# Patient Record
Sex: Male | Born: 1955 | Race: Black or African American | Hispanic: No | Marital: Single | Smoking: Former smoker
Health system: Southern US, Community
[De-identification: ages and names within clinical notes are randomized; demographics above are authoritative.]

## PROBLEM LIST (undated history)

## (undated) DIAGNOSIS — K219 Gastro-esophageal reflux disease without esophagitis: Secondary | ICD-10-CM

## (undated) DIAGNOSIS — F319 Bipolar disorder, unspecified: Secondary | ICD-10-CM

## (undated) DIAGNOSIS — I10 Essential (primary) hypertension: Secondary | ICD-10-CM

## (undated) DIAGNOSIS — F32A Depression, unspecified: Secondary | ICD-10-CM

## (undated) DIAGNOSIS — F329 Major depressive disorder, single episode, unspecified: Secondary | ICD-10-CM

## (undated) HISTORY — DX: Gastro-esophageal reflux disease without esophagitis: K21.9

## (undated) HISTORY — DX: Major depressive disorder, single episode, unspecified: F32.9

## (undated) HISTORY — DX: Depression, unspecified: F32.A

## (undated) HISTORY — PX: BACK SURGERY: SHX140

---

## 2016-03-15 ENCOUNTER — Emergency Department (HOSPITAL_COMMUNITY)
Admission: EM | Admit: 2016-03-15 | Discharge: 2016-03-15 | Disposition: A | Discharge status: Home / Self Care | Payer: Self-pay | Attending: Emergency Medicine | Admitting: Emergency Medicine

## 2016-03-15 ENCOUNTER — Encounter (HOSPITAL_COMMUNITY): Payer: Self-pay | Admitting: *Deleted

## 2016-03-15 ENCOUNTER — Emergency Department (HOSPITAL_COMMUNITY): Payer: Self-pay

## 2016-03-15 DIAGNOSIS — M79644 Pain in right finger(s): Secondary | ICD-10-CM | POA: Insufficient documentation

## 2016-03-15 DIAGNOSIS — Z87891 Personal history of nicotine dependence: Secondary | ICD-10-CM | POA: Insufficient documentation

## 2016-03-15 HISTORY — DX: Bipolar disorder, unspecified: F31.9

## 2016-03-15 MED ORDER — HYDROCODONE-ACETAMINOPHEN 5-325 MG PO TABS: 2.0000 | ORAL_TABLET | ORAL | 0 refills | Status: DC | PRN

## 2016-03-15 MED ORDER — IBUPROFEN 600 MG PO TABS: 600.0000 mg | ORAL_TABLET | Freq: Three times a day (TID) | ORAL | 0 refills | Status: DC

## 2016-03-15 NOTE — ED Notes (Signed)
Declined W/C at D/C and was escorted to lobby by RN. 

## 2016-03-15 NOTE — ED Provider Notes (Signed)
[facility] Provider Note   CSN: [id number] Arrival date & time: 03/15/16  1233  By signing my name below, I, Sonum Patel, attest that this documentation has been prepared under the direction and in the presence of Wells FargoKelly Evalisse Prajapati, PA-C. Electronically Signed: Leone PayorSonum Patel, Scribe. 03/15/16. 3:13 PM.  History   Chief Complaint Chief Complaint  Patient presents with  . Hand Pain    The history is provided by the patient. No language interpreter was used.     HPI Comments: Chad Thornton is a 60 y.o. male who presents to the Emergency Department complaining of gradual onset, constant right 2nd and 3rd finger pain with associated swelling that began last night. He is unsure how he injured the affected area but states it must have happened at work. He denies numbness or weakness. Patient is right hand dominant. He states he has not had this pain in the past. Has not tried anything for pain at home.  Past Medical History:  Diagnosis Date  . Bipolar 1 disorder (HCC)     There are no active problems to display for this patient.   History reviewed. No pertinent surgical history.     Home Medications    Prior to Admission medications   Not on File    Family History No family history on file.  Social History Social History  Substance Use Topics  . Smoking status: Former Games developermoker  . Smokeless tobacco: Never Used  . Alcohol use No     Allergies   Review of patient's allergies indicates no known allergies.   Review of Systems Review of Systems  Musculoskeletal: Positive for arthralgias and joint swelling.  Neurological: Negative for weakness and numbness.     Physical Exam Updated Vital Signs BP 153/83 (BP Location: Right Arm)   Pulse 79   Temp 98.2 F (36.8 C) (Oral)   Ht 6\' 1"  (1.854 m)   SpO2 100%   Physical Exam  Constitutional: He is oriented to person, place, and time. He appears well-developed and well-nourished.  HENT:  Head: Normocephalic and  atraumatic.  Cardiovascular: Normal rate.   Pulmonary/Chest: Effort normal.  Musculoskeletal:  Right wrist and hand: There is mild swelling of PIP and DIP joints of the middle finger with tenderness to palpation. Decreased ROM due to pain and swelling. N/V intact.   Neurological: He is alert and oriented to person, place, and time.  Skin: Skin is warm and dry.  Psychiatric: He has a normal mood and affect.  Nursing note and vitals reviewed.    ED Treatments / Results  DIAGNOSTIC STUDIES: Oxygen Saturation is 100% on RA, normal by my interpretation.    COORDINATION OF CARE: 3:12 PM Discussed treatment plan with pt at bedside and pt agreed to plan.   Labs (all labs ordered are listed, but only abnormal results are displayed) Labs Reviewed - No data to display  EKG  EKG Interpretation None       Radiology Dg Hand Complete Right  Result Date: 03/15/2016 CLINICAL DATA:  Pain and swelling in the right hand, most prominent in the third finger. Injury 1 day prior. Palpable lump in the palmar distal third metacarpal region. EXAM: RIGHT HAND - COMPLETE 3+ VIEW COMPARISON:  None. FINDINGS: The area of symptomatic concern as indicated by the patient in the palmar right distal third metacarpal region was denoted with a metallic skin BB by the technologist. No fracture, dislocation or suspicious focal osseous lesion. There is mild-to-moderate osteoarthritis at the third metacarpophalangeal joint.  There are amorphous periarticular soft tissue calcifications at the radial aspect of the proximal interphalangeal joint of the right third finger with associated soft tissue swelling. No appreciable joint erosions. No radiopaque foreign body. IMPRESSION: 1. No fracture or dislocation. 2. 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 3.  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 Electronically Signed   By: Delbert Phenix M.D.   On: 03/15/2016 14:39    Procedures Procedures (including critical care time)  Medications Ordered in ED Medications - No data to display   Initial Impression / Assessment and Plan / ED Course  I have reviewed the triage vital signs and the nursing notes.  Pertinent labs & imaging results that were available during my care of the patient were reviewed by me and considered in my medical decision making (see chart for details).  Clinical Course   Patient X-Ray negative for obvious fracture or dislocation.  Xray does comment on the possibility of HADD and patient has underlying arthritis of MCP joint. Patient given splint while in ED, conservative therapy recommended and discussed. Follow up with ortho if symptoms persist. Patient will be discharged home & is agreeable with above plan. Returns precautions discussed. Pt appears safe for discharge.   Final Clinical Impressions(s) / ED Diagnoses   Final diagnoses:  Pain of right middle finger    New Prescriptions Discharge Medication List as of 03/15/2016  3:19 PM    START taking these medications   Details  HYDROcodone-acetaminophen (NORCO/VICODIN) 5-325 MG tablet Take 2 tablets by mouth every 4 (four) hours as needed., Starting Mon 03/15/2016, Print    ibuprofen (ADVIL,MOTRIN) 600 MG tablet Take 1 tablet (600 mg total) by mouth 3 (three) times daily., Starting Mon 03/15/2016, Print        I personally performed the services described in this documentation, which was scribed in my presence. The recorded information has been reviewed and is accurate.    Bethel Born, PA-C 03/16/16 1740    Gerhard Munch, MD 03/18/16 [phone]

## 2016-03-15 NOTE — ED Triage Notes (Signed)
Pt sates swelling and difficulty bending R 2nd and 3rd fingers since last night.  Does not remember injuring it, but he works at the [employer] and may have banged it against a cage.

## 2016-04-23 ENCOUNTER — Encounter (HOSPITAL_COMMUNITY): Payer: Self-pay

## 2016-04-23 ENCOUNTER — Emergency Department (HOSPITAL_COMMUNITY)
Admission: EM | Admit: 2016-04-23 | Discharge: 2016-04-23 | Disposition: A | Discharge status: Home / Self Care | Payer: Medicare (Managed Care) | Attending: Emergency Medicine | Admitting: Emergency Medicine

## 2016-04-23 DIAGNOSIS — I10 Essential (primary) hypertension: Secondary | ICD-10-CM | POA: Insufficient documentation

## 2016-04-23 DIAGNOSIS — H578 Other specified disorders of eye and adnexa: Secondary | ICD-10-CM | POA: Diagnosis present

## 2016-04-23 DIAGNOSIS — H1132 Conjunctival hemorrhage, left eye: Secondary | ICD-10-CM | POA: Diagnosis not present

## 2016-04-23 DIAGNOSIS — Z87891 Personal history of nicotine dependence: Secondary | ICD-10-CM | POA: Diagnosis not present

## 2016-04-23 HISTORY — DX: Essential (primary) hypertension: I10

## 2016-04-23 MED ORDER — BENZONATATE 100 MG PO CAPS: 100.0000 mg | ORAL_CAPSULE | Freq: Three times a day (TID) | ORAL | 0 refills | Status: DC

## 2016-04-23 NOTE — ED Provider Notes (Signed)
[facility] Provider Note   CSN: [id number] Arrival date & time: 04/23/16  1720     History   Chief Complaint Chief Complaint  Patient presents with  . Eye Problem    HPI Chad Thornton is a 60 y.o. male.  HPI Patient presents to the emergency room with complaints of eye redness. Patient noticed it when he woke up on Wednesday. He noticed that the medial aspect of his left eye was red. He denies any recent injuries. He denies any drainage. No blurred vision or pain. Patient has developed a cold recently and has been coughing but he does not think he was coughing this first started. Past Medical History:  Diagnosis Date  . Bipolar 1 disorder (HCC)   . Hypertension     There are no active problems to display for this patient.   Past Surgical History:  Procedure Laterality Date  . BACK SURGERY     2000       Home Medications    Prior to Admission medications   Medication Sig Start Date End Date Taking? Authorizing Provider  divalproex (DEPAKOTE) 500 MG DR tablet Take 500 mg by mouth 3 (three) times daily.   Yes Historical Provider, MD  mirtazapine (REMERON) 15 MG tablet Take 15 mg by mouth at bedtime.   Yes Historical Provider, MD  QUEtiapine (SEROQUEL) 400 MG tablet Take 400 mg by mouth at bedtime.   Yes Historical Provider, MD  benzonatate (TESSALON) 100 MG capsule Take 1 capsule (100 mg total) by mouth every 8 (eight) hours. 04/23/16   Linwood Dibbles, MD  HYDROcodone-acetaminophen (NORCO/VICODIN) 5-325 MG tablet Take 2 tablets by mouth every 4 (four) hours as needed. Patient not taking: Reported on 04/23/2016 03/15/16   Bethel Born, PA-C  ibuprofen (ADVIL,MOTRIN) 600 MG tablet Take 1 tablet (600 mg total) by mouth 3 (three) times daily. Patient not taking: Reported on 04/23/2016 03/15/16   Bethel Born, PA-C    Family History No family history on file.  Social History Social History  Substance Use Topics  . Smoking status: Former Games developer  .  Smokeless tobacco: Never Used  . Alcohol use No     Allergies   Review of patient's allergies indicates no known allergies.   Review of Systems Review of Systems  All other systems reviewed and are negative.    Physical Exam Updated Vital Signs BP 158/92   Pulse 89   Temp 98.5 F (36.9 C) (Oral)   Resp 18   Ht 6\' 1"  (1.854 m)   Wt 108.9 kg   SpO2 100%   BMI 31.66 kg/m   Physical Exam  Constitutional: He appears well-developed and well-nourished. No distress.  HENT:  Head: Normocephalic and atraumatic.  Right Ear: External ear normal.  Left Ear: External ear normal.  Eyes: EOM are normal. Pupils are equal, round, and reactive to light. Right eye exhibits no discharge and no exudate. Left eye exhibits no discharge and no exudate. Right conjunctiva is not injected. Left conjunctiva is not injected. Left conjunctiva has a hemorrhage. No scleral icterus.    Neck: Neck supple. No tracheal deviation present.  Cardiovascular: Normal rate.   Pulmonary/Chest: Effort normal and breath sounds normal. No stridor. No respiratory distress.  Abdominal: He exhibits no distension.  Musculoskeletal: He exhibits no edema.  Neurological: He is alert. Cranial nerve deficit: no gross deficits.  Skin: Skin is warm and dry. No rash noted.  Psychiatric: He has a normal mood and affect.  Nursing note  and vitals reviewed.    ED Treatments / Results     Procedures Procedures (including critical care time)    Initial Impression / Assessment and Plan / ED Course  I have reviewed the triage vital signs and the nursing notes.  Pertinent labs & imaging results that were available during my care of the patient were reviewed by me and considered in my medical decision making (see chart for details).  Clinical Course    Patient has a subconjunctival hemorrhage left eye. He has a cold and has been coughing although he thinks this started before the cough. He denies any visual acuity  change. He denies any drainage.  I explained the benign nature of this illness the patient. He can follow-up with an ophthalmologist as needed.  Final Clinical Impressions(s) / ED Diagnoses   Final diagnoses:  Subconjunctival hemorrhage, non-traumatic, left    New Prescriptions New Prescriptions   BENZONATATE (TESSALON) 100 MG CAPSULE    Take 1 capsule (100 mg total) by mouth every 8 (eight) hours.     Linwood DibblesJon Deriyah Kunath, MD 04/23/16 [phone]

## 2016-04-23 NOTE — ED Notes (Signed)
Patient Alert and oriented X4. Stable and ambulatory. Patient verbalized understanding of the discharge instructions.  Patient belongings were taken by the patient.  

## 2016-04-23 NOTE — ED Triage Notes (Signed)
Per pT, Pt woke up on Wednesday morning with redness noted to the inside of the left eye. Pt denies change in vision, pain, or discharge. Hx of HTN, but denies any heaving lifting or excursion.

## 2016-04-23 NOTE — Discharge Instructions (Signed)
You can use over-the-counter artificial tears as needed, follow up with an eye doctor as needed

## 2016-06-29 ENCOUNTER — Encounter (HOSPITAL_COMMUNITY): Payer: Self-pay | Admitting: *Deleted

## 2016-06-29 ENCOUNTER — Emergency Department (HOSPITAL_COMMUNITY)
Admission: EM | Admit: 2016-06-29 | Discharge: 2016-06-29 | Disposition: A | Discharge status: Home / Self Care | Payer: Medicare (Managed Care) | Attending: Emergency Medicine | Admitting: Emergency Medicine

## 2016-06-29 DIAGNOSIS — I10 Essential (primary) hypertension: Secondary | ICD-10-CM | POA: Insufficient documentation

## 2016-06-29 DIAGNOSIS — R51 Headache: Secondary | ICD-10-CM | POA: Insufficient documentation

## 2016-06-29 DIAGNOSIS — Z87891 Personal history of nicotine dependence: Secondary | ICD-10-CM | POA: Insufficient documentation

## 2016-06-29 DIAGNOSIS — M545 Low back pain, unspecified: Secondary | ICD-10-CM

## 2016-06-29 DIAGNOSIS — Z79899 Other long term (current) drug therapy: Secondary | ICD-10-CM | POA: Insufficient documentation

## 2016-06-29 DIAGNOSIS — R519 Headache, unspecified: Secondary | ICD-10-CM

## 2016-06-29 DIAGNOSIS — M62838 Other muscle spasm: Secondary | ICD-10-CM | POA: Diagnosis not present

## 2016-06-29 DIAGNOSIS — M542 Cervicalgia: Secondary | ICD-10-CM | POA: Diagnosis present

## 2016-06-29 MED ORDER — CYCLOBENZAPRINE HCL 10 MG PO TABS
5.0000 mg | ORAL_TABLET | Freq: Once | ORAL | Status: AC
  Administered 2016-06-29: 5 mg via ORAL
  Filled 2016-06-29: qty 1

## 2016-06-29 MED ORDER — CYCLOBENZAPRINE HCL 10 MG PO TABS: 10.0000 mg | ORAL_TABLET | Freq: Two times a day (BID) | ORAL | 0 refills | Status: DC | PRN

## 2016-06-29 MED ORDER — ACETAMINOPHEN 325 MG PO TABS
325.0000 mg | ORAL_TABLET | Freq: Once | ORAL | Status: AC
  Administered 2016-06-29: 325 mg via ORAL
  Filled 2016-06-29: qty 1

## 2016-06-29 MED ORDER — ACETAMINOPHEN 500 MG PO TABS: 500.0000 mg | ORAL_TABLET | Freq: Four times a day (QID) | ORAL | 0 refills | Status: DC | PRN

## 2016-06-29 MED FILL — ?CYCLOBENZAPRINE 10 MG TABL: 10 | 10 days supply | Qty: 20 | Fill #0

## 2016-06-29 NOTE — ED Triage Notes (Signed)
C/o stiff neck onset 5-6 days ago no injury . States it isn't getting anyt better

## 2016-06-29 NOTE — Discharge Instructions (Signed)
Please take Tylenol as needed for pain. Please take one tablet of Flexeril twice a day as needed for muscle spasm. These use warm compresses to help alleviate pain.  SEEK MEDICAL CARE IF:   Your cramps or spasms get more severe, more frequent, or do not improve over time.  Your headache becomes severe. You have repeated vomiting. You have a stiff neck. You have a loss of vision. You have problems with speech. You have pain in the eye or ear. You have muscular weakness or loss of muscle control. You lose your balance or have trouble walking. You feel faint or pass out. You have confusion. You develop new bowel or bladder control problems. You have unusual weakness or numbness in your arms or legs. You develop nausea or vomiting. You develop abdominal pain. You feel faint. You develop new bowel or bladder control problems. You have unusual weakness or numbness in your arms or legs. You develop nausea or vomiting. You develop abdominal pain. You feel faint.

## 2016-06-29 NOTE — ED Provider Notes (Signed)
[facility] Provider Note   CSN: [id number] Arrival date & time: 06/29/16  0901  By signing my name below, I, Soijett Blue, attest that this documentation has been prepared under the direction and in the presence of Lorretta Harp, PA-C Electronically Signed: Soijett Blue, ED Scribe. 06/29/16. 9:31 AM.  History   Chief Complaint Chief Complaint  Patient presents with  . Neck Pain    HPI Chad Thornton is a 61 y.o. male with a PMHx of HTN, who presents to the Emergency Department complaining of 10/10, aching, left sided neck pain onset 5-6 days ago. Pt noticed his left sided neck pain upon waking up. Pt notes that his left sided neck pain radiates to his head and his upper back. Pt left sided neck pain is worsened throughout the day and with movement.  He is having associated symptoms of lower back pain x 5-6 days and generalized HA x 5-6 days. 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 He denies fever, chills, left shoulder pain, dysuria, frequency, constipation, diarrhea, blurred vision, vision change, gait problem, numbness, tingling, CP, SOB, history of cancer, IVDU, and any other symptoms. Denies recent injury or trauma. 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     The history is provided by the patient. No language interpreter was used.    Past Medical History:  Diagnosis Date  . Bipolar 1 disorder (HCC)   . Hypertension     There are no active problems to display for this patient.   Past Surgical History:  Procedure Laterality Date  . BACK SURGERY     2000       Home Medications    Prior to Admission medications   Medication Sig Start Date End Date Taking? Authorizing Provider  acetaminophen (TYLENOL) 500 MG tablet Take 1 tablet (500 mg total) by mouth every 6 (six) hours as needed. 06/29/16   Francisco Manuel [place], [place]  benzonatate (TESSALON) 100 MG capsule Take 1 capsule (100 mg total) by mouth every 8  (eight) hours. 04/23/16   Linwood Dibbles, MD  cyclobenzaprine (FLEXERIL) 10 MG tablet Take 1 tablet (10 mg total) by mouth 2 (two) times daily as needed for muscle spasms. 06/29/16   Francisco Manuel [place], [place]  divalproex (DEPAKOTE) 500 MG DR tablet Take 500 mg by mouth 3 (three) times daily.    Historical Provider, MD  HYDROcodone-acetaminophen (NORCO/VICODIN) 5-325 MG tablet Take 2 tablets by mouth every 4 (four) hours as needed. Patient not taking: Reported on 04/23/2016 03/15/16   Bethel Born, PA-C  ibuprofen (ADVIL,MOTRIN) 600 MG tablet Take 1 tablet (600 mg total) by mouth 3 (three) times daily. Patient not taking: Reported on 04/23/2016 03/15/16   Bethel Born, PA-C  mirtazapine (REMERON) 15 MG tablet Take 15 mg by mouth at bedtime.    Historical Provider, MD  QUEtiapine (SEROQUEL) 400 MG tablet Take 400 mg by mouth at bedtime.    Historical Provider, MD    Family History History reviewed. No pertinent family history.  Social History Social History  Substance Use Topics  . Smoking status: Former Games developer  . Smokeless tobacco: Never Used  . Alcohol use No     Allergies   Patient has no known allergies.   Review of Systems Review of Systems  Constitutional: Negative for chills and fever.  Eyes: Negative for visual disturbance.  Respiratory: Negative for shortness of breath.   Cardiovascular: Negative for chest  pain.  Gastrointestinal: Negative for constipation and diarrhea.  Genitourinary: Negative for difficulty urinating, dysuria and hematuria.  Musculoskeletal: Positive for back pain (upper and lower) and neck pain (left sided). Negative for arthralgias and gait problem.  Skin: Negative for color change, rash and wound.  Neurological: Positive for headaches (generalized). Negative for numbness.       No tingling     Physical Exam Updated Vital Signs BP 164/78 (BP Location: Right Arm)   Pulse 77   Temp 98.3 F (36.8 C) (Oral)   Resp 18   Ht 6\' 1"  (1.854 m)    Wt 113.4 kg   SpO2 100%   BMI 32.98 kg/m   Physical Exam  Constitutional: He is oriented to person, place, and time. He appears well-developed and well-nourished. No distress.  HENT:  Head: Normocephalic and atraumatic.  Eyes: Conjunctivae and EOM are normal. Pupils are equal, round, and reactive to light.  Neck: Normal range of motion. Neck supple. Muscular tenderness present. No spinous process tenderness present.  No erythema, swelling, edema, or deformity. TTP in left paraspinal muscles. No central tenderness to spinous process. Full passive ROM, but slightly decreased on active ROM.   Cardiovascular: Normal rate.   Pulmonary/Chest: Effort normal. No respiratory distress.  Abdominal: He exhibits no distension and no mass. There is no tenderness. There is no tenderness at McBurney's point and negative Murphy's sign.  No focal tenderness. No palpable pulsatile mass. Negative murphy's sign. Negative McBurney's point.   Musculoskeletal: Normal range of motion.  Neurological: He is alert and oriented to person, place, and time. He has normal strength. No sensory deficit. Coordination and gait normal.  Cranial Nerves:  III,IV, VI: ptosis not present, extra-ocular movements intact bilaterally, direct and consensual pupillary light reflexes intact bilaterally V: facial sensation, jaw opening, and bite strength equal bilaterally VII: eyebrow raise, eyelid close, smile, frown, pucker equal bilaterally VIII: hearing grossly normal bilaterally  IX,X: palate elevation and swallowing intact XI: bilateral shoulder shrug and lateral head rotation equal and strong XII: midline tongue extension  Negative pronator drift, negative finger to nose, heel to shin, negative finger to nose, negative RAMs.   Patient able to ambulate without difficulty.  Strength and sensation intact to BUE and BLE.   Skin: Skin is warm and dry.  Psychiatric: He has a normal mood and affect. His behavior is normal.  Nursing  note and vitals reviewed.   ED Treatments / Results  DIAGNOSTIC STUDIES: Oxygen Saturation is 100% on RA, nl by my interpretation.    COORDINATION OF CARE: 9:27 AM Discussed treatment plan with pt at bedside which includes tylenol Rx and flexeril Rx, warm compresses, and pt agreed to plan.   Procedures Procedures (including critical care time)  Medications Ordered in ED Medications  acetaminophen (TYLENOL) tablet 325 mg (325 mg Oral Given 06/29/16 0940)  cyclobenzaprine (FLEXERIL) tablet 5 mg (5 mg Oral Given 06/29/16 0940)     Initial Impression / Assessment and Plan / ED Course  I have reviewed the triage vital signs and the nursing notes.  Clinical Course   Patient is a 61 year old male presenting with left-sided neck pain 4 days. On exam patient is afebrile, apparent distress, vital signs stable. Heart and lung sounds clear. TTP to left cervical paraspinal muscles/trapezius. No redness, edema, or deformity noted. Abdomen soft nontender. Negative Murphy sign, no focal tenderness at McBurney's point. No palpable mass. Neuro exam is normal. Cranialnerves III through XII intact. Negative pronator drift, negative finger to nose, negative  RAMs. Patient able to walk without difficulty.  No warning symptoms of back pain including: fecal incontinence, urinary retention or overflow incontinence, night sweats, waking from sleep with back pain, unexplained fevers or weight loss, h/o cancer, IVDU, recent trauma. No concern for cauda equina, epidural abscess, or other serious cause of back pain. Conservative measures such as rest, ice/heat and pain medicine indicated with PCP follow-up if no improvement with conservative management.    Final Clinical Impressions(s) / ED Diagnoses   Final diagnoses:  Muscle spasms of neck  Nonintractable headache, unspecified chronicity pattern, unspecified headache type  Acute left-sided low back pain without sciatica    New Prescriptions Discharge  Medication List as of 06/29/2016  9:39 AM    START taking these medications   Details  acetaminophen (TYLENOL) 500 MG tablet Take 1 tablet (500 mg total) by mouth every 6 (six) hours as needed., Starting Tue 06/29/2016, Print    cyclobenzaprine (FLEXERIL) 10 MG tablet Take 1 tablet (10 mg total) by mouth 2 (two) times daily as needed for muscle spasms., Starting Tue 06/29/2016, Print       I personally performed the services described in this documentation, which was scribed in my presence. The recorded information has been reviewed and is accurate.     [address]Francisco Manuel ElmoEspina, [place] 06/29/16 1734    Jacalyn LefevreJulie Haviland, MD 07/01/16 [phone]

## 2016-08-24 ENCOUNTER — Emergency Department (HOSPITAL_COMMUNITY): Payer: Medicare (Managed Care)

## 2016-08-24 ENCOUNTER — Emergency Department (HOSPITAL_COMMUNITY)
Admission: EM | Admit: 2016-08-24 | Discharge: 2016-08-24 | Disposition: A | Discharge status: Home / Self Care | Payer: Medicare (Managed Care) | Attending: Emergency Medicine | Admitting: Emergency Medicine

## 2016-08-24 ENCOUNTER — Encounter (HOSPITAL_COMMUNITY): Payer: Self-pay | Admitting: Emergency Medicine

## 2016-08-24 DIAGNOSIS — Z79899 Other long term (current) drug therapy: Secondary | ICD-10-CM | POA: Diagnosis not present

## 2016-08-24 DIAGNOSIS — J111 Influenza due to unidentified influenza virus with other respiratory manifestations: Secondary | ICD-10-CM | POA: Diagnosis not present

## 2016-08-24 DIAGNOSIS — Z87891 Personal history of nicotine dependence: Secondary | ICD-10-CM | POA: Diagnosis not present

## 2016-08-24 DIAGNOSIS — I10 Essential (primary) hypertension: Secondary | ICD-10-CM | POA: Diagnosis not present

## 2016-08-24 DIAGNOSIS — R69 Illness, unspecified: Secondary | ICD-10-CM

## 2016-08-24 DIAGNOSIS — R509 Fever, unspecified: Secondary | ICD-10-CM | POA: Diagnosis present

## 2016-08-24 MED ORDER — FLUTICASONE PROPIONATE 50 MCG/ACT NA SUSP: 2.0000 | Freq: Every day | NASAL | 0 refills | Status: DC

## 2016-08-24 MED ORDER — ALBUTEROL SULFATE HFA 108 (90 BASE) MCG/ACT IN AERS
2.0000 | INHALATION_SPRAY | Freq: Once | RESPIRATORY_TRACT | Status: AC
  Administered 2016-08-24: 2 via RESPIRATORY_TRACT
  Filled 2016-08-24: qty 6.7

## 2016-08-24 MED ORDER — CETIRIZINE HCL 10 MG PO TABS: 10.0000 mg | ORAL_TABLET | Freq: Every day | ORAL | 1 refills | Status: DC

## 2016-08-24 MED ORDER — ACETAMINOPHEN 325 MG PO TABS: 650.0000 mg | ORAL_TABLET | Freq: Four times a day (QID) | ORAL | 0 refills | Status: DC | PRN

## 2016-08-24 MED ORDER — BENZONATATE 100 MG PO CAPS: 100.0000 mg | ORAL_CAPSULE | Freq: Three times a day (TID) | ORAL | 0 refills | Status: DC | PRN

## 2016-08-24 MED ORDER — IPRATROPIUM BROMIDE 0.02 % IN SOLN
0.5000 mg | Freq: Once | RESPIRATORY_TRACT | Status: AC
  Administered 2016-08-24: 0.5 mg via RESPIRATORY_TRACT
  Filled 2016-08-24: qty 2.5

## 2016-08-24 MED ORDER — ALBUTEROL SULFATE (2.5 MG/3ML) 0.083% IN NEBU
5.0000 mg | INHALATION_SOLUTION | Freq: Once | RESPIRATORY_TRACT | Status: AC
  Administered 2016-08-24: 5 mg via RESPIRATORY_TRACT
  Filled 2016-08-24: qty 6

## 2016-08-24 NOTE — ED Provider Notes (Signed)
[facility]EMER[facility]ENCY DEPT Provider Note   CSN: [id number] Arrival date & time: 08/24/16  0801     History   Chief Complaint Chief Complaint  Patient presents with  . Influenza    HPI Chad Thornton is a 61 y.o. male.  Chad Thornton is a 61 y.o. Male who presents to the ED complaining of flu like symptoms for the past 5 days. Patient reports 5 days of subjective fever, coughing, wheezing, chest tightness, nasal congestion, postnasal drip, sore throat, runny nose and body aches. Patient reports he did not receive his flu shot this year. He reports taking NyQuil prior to arrival this morning with some relief of his symptoms. He denies hemoptysis, leg pain, leg swelling, abdominal pain, vomiting, diarrhea, urinary symptoms, rashes, trouble swallowing, lightheadedness or syncope.   The history is provided by the patient and medical records. No language interpreter was used.  Influenza  Presenting symptoms: cough, fever, myalgias, rhinorrhea, shortness of breath and sore throat   Presenting symptoms: no diarrhea, no headaches, no nausea and no vomiting   Associated symptoms: chills and nasal congestion     Past Medical History:  Diagnosis Date  . Bipolar 1 disorder (HCC)   . Hypertension     There are no active problems to display for this patient.   Past Surgical History:  Procedure Laterality Date  . BACK SURGERY     2000       Home Medications    Prior to Admission medications   Medication Sig Start Date End Date Taking? Authorizing Provider  cyclobenzaprine (FLEXERIL) 10 MG tablet Take 1 tablet (10 mg total) by mouth 2 (two) times daily as needed for muscle spasms. 06/29/16  Yes Francisco [address] [place], [place]  divalproex (DEPAKOTE) 500 MG DR tablet Take 1,500 mg by mouth at bedtime.    Yes Historical Provider, MD  DM-Doxylamine-Acetaminophen (NYQUIL COLD & FLU PO) Take 1 capsule by mouth daily as needed (cold symptoms).   Yes Historical Provider, MD  ibuprofen (ADVIL,MOTRIN)  600 MG tablet Take 1 tablet (600 mg total) by mouth 3 (three) times daily. 03/15/16  Yes Bethel Born, PA-C  mirtazapine (REMERON) 15 MG tablet Take 15 mg by mouth at bedtime.   Yes Historical Provider, MD  Misc Natural Products (NF FORMULAS TESTOSTERONE) CAPS Take 2 capsules by mouth every other day.   Yes Historical Provider, MD  QUEtiapine (SEROQUEL) 400 MG tablet Take 400 mg by mouth at bedtime.   Yes Historical Provider, MD  acetaminophen (TYLENOL) 325 MG tablet Take 2 tablets (650 mg total) by mouth every 6 (six) hours as needed for mild pain, moderate pain or fever. 08/24/16   Everlene Farrier, PA-C  benzonatate (TESSALON) 100 MG capsule Take 1 capsule (100 mg total) by mouth 3 (three) times daily as needed. 08/24/16   Everlene Farrier, PA-C  cetirizine (ZYRTEC ALLERGY) 10 MG tablet Take 1 tablet (10 mg total) by mouth daily. 08/24/16   Everlene Farrier, PA-C  fluticasone (FLONASE) 50 MCG/ACT nasal spray Place 2 sprays into both nostrils daily. 08/24/16   Everlene Farrier, PA-C    Family History History reviewed. No pertinent family history.  Social History Social History  Substance Use Topics  . Smoking status: Former Games developer  . Smokeless tobacco: Never Used  . Alcohol use No     Allergies   Patient has no known allergies.   Review of Systems Review of Systems  Constitutional: Positive for chills and fever.  HENT: Positive for congestion, postnasal drip, rhinorrhea, sneezing and  sore throat. Negative for trouble swallowing.   Eyes: Negative for visual disturbance.  Respiratory: Positive for cough, chest tightness, shortness of breath and wheezing.   Cardiovascular: Negative for chest pain, palpitations and leg swelling.  Gastrointestinal: Negative for abdominal pain, diarrhea, nausea and vomiting.  Genitourinary: Negative for difficulty urinating and dysuria.  Musculoskeletal: Positive for myalgias. Negative for back pain and neck pain.  Skin: Negative for rash.  Neurological:  Negative for syncope, light-headedness and headaches.     Physical Exam Updated Vital Signs BP 197/97 (BP Location: Right Arm)   Pulse 82   Temp 97.9 F (36.6 C) (Oral)   Resp 20   SpO2 97%   Physical Exam  Constitutional: He is oriented to person, place, and time. He appears well-developed and well-nourished. No distress.  Nontoxic appearing.  HENT:  Head: Normocephalic and atraumatic.  Right Ear: External ear normal.  Left Ear: External ear normal.  Mouth/Throat: Oropharynx is clear and moist.  Mild clear middle ear effusion noted bilaterally without any erythema or loss of landmarks.  Boggy nasal turbinates bilaterally. Rhinorrhea present. Mild bilateral tonsillar hypertrophy without exudates. Uvula is midline without edema. No PTA. No trismus. No drooling.  Eyes: Conjunctivae are normal. Pupils are equal, round, and reactive to light. Right eye exhibits no discharge. Left eye exhibits no discharge.  Neck: Neck supple.  Cardiovascular: Normal rate, regular rhythm, normal heart sounds and intact distal pulses.  Exam reveals no gallop and no friction rub.   No murmur heard. Pulmonary/Chest: Effort normal. No respiratory distress. He has wheezes. He has no rales.  Expiratory wheezes noted bilaterally. No increased work of breathing. No rales or rhonchi. Patient speaking in complete sentences.  Abdominal: Soft. There is no tenderness. There is no guarding.  Musculoskeletal: He exhibits no edema or tenderness.  No lower extremity edema or tenderness.  Lymphadenopathy:    He has no cervical adenopathy.  Neurological: He is alert and oriented to person, place, and time. Coordination normal.  Skin: Skin is warm and dry. Capillary refill takes less than 2 seconds. No rash noted. He is not diaphoretic. No erythema. No pallor.  Psychiatric: He has a normal mood and affect. His behavior is normal.  Nursing note and vitals reviewed.    ED Treatments / Results  Labs (all labs ordered  are listed, but only abnormal results are displayed) Labs Reviewed - No data to display  EKG  EKG Interpretation None       Radiology Dg Chest 2 View  Result Date: 08/24/2016 CLINICAL DATA:  Cough and fever. EXAM: CHEST  2 VIEW COMPARISON:  None. FINDINGS: The heart size and mediastinal contours are within normal limits. Both lungs are clear. The visualized skeletal structures are unremarkable. IMPRESSION: Normal exam. Electronically Signed   By: Francene Boyers M.D.   On: 08/24/2016 08:35    Procedures Procedures (including critical care time)  Medications Ordered in ED Medications  albuterol (PROVENTIL HFA;VENTOLIN HFA) 108 (90 Base) MCG/ACT inhaler 2 puff (not administered)  albuterol (PROVENTIL) (2.5 MG/3ML) 0.083% nebulizer solution 5 mg (5 mg Nebulization Given 08/24/16 0859)  ipratropium (ATROVENT) nebulizer solution 0.5 mg (0.5 mg Nebulization Given 08/24/16 0859)     Initial Impression / Assessment and Plan / ED Course  I have reviewed the triage vital signs and the nursing notes.  Pertinent labs & imaging results that were available during my care of the patient were reviewed by me and considered in my medical decision making (see chart for details).  This is a 61 y.o. Male who presents to the ED complaining of flu like symptoms for the past 5 days. Patient reports 5 days of subjective fever, coughing, wheezing, chest tightness, nasal congestion, postnasal drip, sore throat, runny nose and body aches. Patient reports he did not receive his flu shot this year. He reports taking NyQuil prior to arrival this morning with some relief of his symptoms. On exam the patient is afebrile nontoxic appearing. His rhinorrhea present. Boggy nasal turbinates bilaterally. He is not expiratory wheezing noted bilaterally. No increased work of breathing. Chest x-ray is unremarkable. No evidence of pneumonia. Patient received breathing treatment at reevaluation he tells me he is feeling much  better. Feels that his chest tightness and wheezing has resolved. We'll discharge with albuterol inhaler. Advised she can use this every 6 hours as needed for wheezing. Also discharge with prescriptions for Tylenol, some pros, Flonase and Zyrtec. Suspect influenza-like illness. I discussed strict and specific return precautions. I advised the patient to follow-up with their primary care provider this week. I advised the patient to return to the emergency department with new or worsening symptoms or new concerns. The patient verbalized understanding and agreement with plan.      Final Clinical Impressions(s) / ED Diagnoses   Final diagnoses:  Influenza-like illness    New Prescriptions New Prescriptions   ACETAMINOPHEN (TYLENOL) 325 MG TABLET    Take 2 tablets (650 mg total) by mouth every 6 (six) hours as needed for mild pain, moderate pain or fever.   BENZONATATE (TESSALON) 100 MG CAPSULE    Take 1 capsule (100 mg total) by mouth 3 (three) times daily as needed.   CETIRIZINE (ZYRTEC ALLERGY) 10 MG TABLET    Take 1 tablet (10 mg total) by mouth daily.   FLUTICASONE (FLONASE) 50 MCG/ACT NASAL SPRAY    Place 2 sprays into both nostrils daily.     Everlene FarrierWilliam Jonathandavid Marlett, PA-C 08/24/16 1039    Arby BarretteMarcy Pfeiffer, MD 09/01/16 [phone]

## 2016-08-24 NOTE — ED Triage Notes (Signed)
Pt arrives via POV from home with inspiratory/ expiratory wheezing, cough, bodyaches since Thursday. Denies recent fever. VSS. Pt ambulatory.

## 2016-09-10 ENCOUNTER — Emergency Department (HOSPITAL_COMMUNITY): Payer: Medicare (Managed Care)

## 2016-09-10 ENCOUNTER — Emergency Department (HOSPITAL_COMMUNITY)
Admission: EM | Admit: 2016-09-10 | Discharge: 2016-09-10 | Disposition: A | Discharge status: Home / Self Care | Payer: Medicare (Managed Care) | Attending: Emergency Medicine | Admitting: Emergency Medicine

## 2016-09-10 ENCOUNTER — Encounter (HOSPITAL_COMMUNITY): Payer: Self-pay

## 2016-09-10 DIAGNOSIS — Z87891 Personal history of nicotine dependence: Secondary | ICD-10-CM | POA: Diagnosis not present

## 2016-09-10 DIAGNOSIS — I10 Essential (primary) hypertension: Secondary | ICD-10-CM | POA: Diagnosis not present

## 2016-09-10 DIAGNOSIS — H66002 Acute suppurative otitis media without spontaneous rupture of ear drum, left ear: Secondary | ICD-10-CM | POA: Diagnosis not present

## 2016-09-10 DIAGNOSIS — R0981 Nasal congestion: Secondary | ICD-10-CM | POA: Insufficient documentation

## 2016-09-10 DIAGNOSIS — R05 Cough: Secondary | ICD-10-CM | POA: Diagnosis present

## 2016-09-10 DIAGNOSIS — R6889 Other general symptoms and signs: Secondary | ICD-10-CM

## 2016-09-10 DIAGNOSIS — Z79899 Other long term (current) drug therapy: Secondary | ICD-10-CM | POA: Insufficient documentation

## 2016-09-10 LAB — BASIC METABOLIC PANEL
Anion gap: 6 (ref 5–15)
BUN: 15 mg/dL (ref 6–20)
CO2: 28 mmol/L (ref 22–32)
Calcium: 9 mg/dL (ref 8.9–10.3)
Chloride: 102 mmol/L (ref 101–111)
Creatinine, Ser: 1.03 mg/dL (ref 0.61–1.24)
GFR calc Af Amer: >60 mL/min (ref >60)
GFR calc non Af Amer: >60 mL/min (ref >60)
Glucose, Bld: 112 mg/dL — ABNORMAL HIGH (ref 65–99)
Potassium: 4.3 mmol/L (ref 3.5–5.1)
Sodium: 136 mmol/L (ref 135–145)

## 2016-09-10 LAB — CBC WITH DIFFERENTIAL/PLATELET
Basophils Absolute: 0 10*3/uL (ref 0.0–0.1)
Basophils Relative: 0 %
Eosinophils Absolute: 0.5 10*3/uL (ref 0.0–0.7)
Eosinophils Relative: 6 %
HCT: 36.3 % — ABNORMAL LOW (ref 39.0–52.0)
Hemoglobin: 11.7 g/dL — ABNORMAL LOW (ref 13.0–17.0)
Lymphocytes Relative: 19 %
Lymphs Abs: 1.7 10*3/uL (ref 0.7–4.0)
MCH: 31.5 pg (ref 26.0–34.0)
MCHC: 32.2 g/dL (ref 30.0–36.0)
MCV: 97.8 fL (ref 78.0–100.0)
Monocytes Absolute: 1.3 10*3/uL — ABNORMAL HIGH (ref 0.1–1.0)
Monocytes Relative: 14 %
Neutro Abs: 5.4 10*3/uL (ref 1.7–7.7)
Neutrophils Relative %: 61 %
Platelets: 360 10*3/uL (ref 150–400)
RBC: 3.71 MIL/uL — ABNORMAL LOW (ref 4.22–5.81)
RDW: 14 % (ref 11.5–15.5)
WBC: 8.8 10*3/uL (ref 4.0–10.5)

## 2016-09-10 LAB — RAPID STREP SCREEN (MED CTR MEBANE ONLY): Streptococcus, Group A Screen (Direct): NEGATIVE

## 2016-09-10 MED ORDER — OSELTAMIVIR PHOSPHATE 75 MG PO CAPS
75.0000 mg | ORAL_CAPSULE | Freq: Once | ORAL | Status: AC
  Administered 2016-09-10: 75 mg via ORAL
  Filled 2016-09-10: qty 1

## 2016-09-10 MED ORDER — NAPROXEN 500 MG PO TABS: 500.0000 mg | ORAL_TABLET | Freq: Two times a day (BID) | ORAL | 0 refills | Status: DC

## 2016-09-10 MED ORDER — DM-GUAIFENESIN ER 30-600 MG PO TB12: 1.0000 | ORAL_TABLET | Freq: Two times a day (BID) | ORAL | 1 refills | Status: DC

## 2016-09-10 MED ORDER — KETOROLAC TROMETHAMINE 15 MG/ML IJ SOLN
15.0000 mg | Freq: Once | INTRAMUSCULAR | Status: AC
  Administered 2016-09-10: 15 mg via INTRAVENOUS
  Filled 2016-09-10: qty 1

## 2016-09-10 MED ORDER — AMOXICILLIN 500 MG PO CAPS: 500.0000 mg | ORAL_CAPSULE | Freq: Three times a day (TID) | ORAL | 0 refills | Status: DC

## 2016-09-10 MED ORDER — SODIUM CHLORIDE 0.9 % IV SOLN
INTRAVENOUS | Status: DC
  Administered 2016-09-10: 11:00:00 via INTRAVENOUS

## 2016-09-10 MED ORDER — SODIUM CHLORIDE 0.9 % IV BOLUS (SEPSIS)
500.0000 mL | Freq: Once | INTRAVENOUS | Status: AC
  Administered 2016-09-10: 500 mL via INTRAVENOUS

## 2016-09-10 MED ORDER — OSELTAMIVIR PHOSPHATE 75 MG PO CAPS: 75.0000 mg | ORAL_CAPSULE | Freq: Two times a day (BID) | ORAL | 0 refills | Status: DC

## 2016-09-10 NOTE — ED Triage Notes (Signed)
Pt presents for evaluation of continued flu like symptoms x 2 weeks. Pt reports seen here previously, no improvement since. Pt reports productive cough with yellow sputum, fevers at home, and L ear pain. Pt ambulatory, AxO x4.

## 2016-09-10 NOTE — Discharge Instructions (Signed)
Take antibiotic amoxicillin as directed for the ear infection. Take Tamiflu for the flulike illness. Take the Naprosyn for the bodyaches and sore throat and ear pain. Taking Mucinex DM for cough and congestion. Work note provided. Return for any new or worse symptoms.

## 2016-09-10 NOTE — ED Provider Notes (Signed)
[facility] Provider Note   CSN: [id number] Arrival date & time: 09/10/16  0803     History   Chief Complaint Chief Complaint  Patient presents with  . Influenza    HPI Chad Thornton is a 61 y.o. male.  Patient with new onset of bodyaches sore throat and fevers feeling cough productive cough and left ear pain starting on Wednesday 2 days ago. Patient denies any nausea vomiting or diarrhea. Patient had similar type illness several weeks ago. These symptoms are new as of 2 days ago.      Past Medical History:  Diagnosis Date  . Bipolar 1 disorder (HCC)   . Hypertension     There are no active problems to display for this patient.   Past Surgical History:  Procedure Laterality Date  . BACK SURGERY     2000       Home Medications    Prior to Admission medications   Medication Sig Start Date End Date Taking? Authorizing Provider  cetirizine (ZYRTEC ALLERGY) 10 MG tablet Take 1 tablet (10 mg total) by mouth daily. 08/24/16  Yes Everlene FarrierWilliam Dansie, PA-C  cyclobenzaprine (FLEXERIL) 10 MG tablet Take 1 tablet (10 mg total) by mouth 2 (two) times daily as needed for muscle spasms. 06/29/16  Yes Francisco [address]  divalproex (DEPAKOTE) 500 MG DR tablet Take 1,500 mg by mouth at bedtime.    Yes Historical Provider, MD  fluticasone (FLONASE) 50 MCG/ACT nasal spray Place 2 sprays into both nostrils daily. 08/24/16  Yes Everlene FarrierWilliam Dansie, PA-C  mirtazapine (REMERON) 30 MG tablet Take 30 mg by mouth at bedtime.    Yes Historical Provider, MD  QUEtiapine (SEROQUEL) 400 MG tablet Take 400 mg by mouth at bedtime.   Yes Historical Provider, MD  acetaminophen (TYLENOL) 325 MG tablet Take 2 tablets (650 mg total) by mouth every 6 (six) hours as needed for mild pain, moderate pain or fever. Patient not taking: Reported on 09/10/2016 08/24/16   Everlene FarrierWilliam Dansie, PA-C  amoxicillin (AMOXIL) 500 MG capsule Take 1 capsule (500 mg total) by mouth 3 (three) times daily. 09/10/16   Vanetta MuldersScott  Vennesa Bastedo, MD  benzonatate (TESSALON) 100 MG capsule Take 1 capsule (100 mg total) by mouth 3 (three) times daily as needed. Patient not taking: Reported on 09/10/2016 08/24/16   Everlene FarrierWilliam Dansie, PA-C  dextromethorphan-guaiFENesin [facility](MUCINEX DM) 30-600 MG 12hr tablet Take 1 tablet by mouth 2 (two) times daily. 09/10/16   Vanetta MuldersScott Myrna Vonseggern, MD  ibuprofen (ADVIL,MOTRIN) 600 MG tablet Take 1 tablet (600 mg total) by mouth 3 (three) times daily. Patient not taking: Reported on 09/10/2016 03/15/16   Bethel BornKelly Marie Gekas, PA-C  naproxen (NAPROSYN) 500 MG tablet Take 1 tablet (500 mg total) by mouth 2 (two) times daily. 09/10/16   Vanetta MuldersScott Antiono Ettinger, MD  oseltamivir (TAMIFLU) 75 MG capsule Take 1 capsule (75 mg total) by mouth every 12 (twelve) hours. 09/10/16   Vanetta MuldersScott Raffi Milstein, MD    Family History No family history on file.  Social History Social History  Substance Use Topics  . Smoking status: Former Games developermoker  . Smokeless tobacco: Never Used  . Alcohol use No     Allergies   Patient has no known allergies.   Review of Systems Review of Systems  Constitutional: Positive for fever.  HENT: Positive for congestion, ear pain and sore throat.   Eyes: Negative for redness.  Respiratory: Positive for cough.   Cardiovascular: Negative for chest pain.  Gastrointestinal: Negative for abdominal pain.  Genitourinary: Negative  for dysuria.  Musculoskeletal: Positive for myalgias.  Skin: Negative for rash.  Neurological: Negative for headaches.  Hematological: Does not bruise/bleed easily.  Psychiatric/Behavioral: Negative for confusion.     Physical Exam Updated Vital Signs BP (!) 157/93 (BP Location: Left Arm)   Pulse 89   Temp 99.1 F (37.3 C) (Oral)   Resp 16   Ht 6\' 1"  (1.854 m)   Wt 121.1 kg   SpO2 96%   BMI 35.23 kg/m   Physical Exam  Constitutional: He is oriented to person, place, and time. He appears well-developed and well-nourished. No distress.  HENT:  Head: Normocephalic and  atraumatic.  Right Ear: External ear normal.  Mouth/Throat: Oropharynx is clear and moist. No oropharyngeal exudate.  Left TM is red and bulging.  Eyes: Conjunctivae and EOM are normal. Pupils are equal, round, and reactive to light.  Neck: Normal range of motion. Neck supple.  Cardiovascular: Normal rate, regular rhythm and normal heart sounds.   Pulmonary/Chest: Effort normal and breath sounds normal. No respiratory distress. He has no wheezes. He has no rales.  Abdominal: Soft. Bowel sounds are normal. There is no tenderness.  Musculoskeletal: Normal range of motion.  Neurological: He is alert and oriented to person, place, and time. No cranial nerve deficit or sensory deficit. He exhibits normal muscle tone. Coordination normal.  Skin: Skin is warm.  Nursing note and vitals reviewed.    ED Treatments / Results  Labs (all labs ordered are listed, but only abnormal results are displayed) Labs Reviewed  CBC WITH DIFFERENTIAL/PLATELET - Abnormal; Notable for the following:       Result Value   RBC 3.71 (*)    Hemoglobin 11.7 (*)    HCT 36.3 (*)    Monocytes Absolute 1.3 (*)    All other components within normal limits  BASIC METABOLIC PANEL - Abnormal; Notable for the following:    Glucose, Bld 112 (*)    All other components within normal limits  RAPID STREP SCREEN (NOT AT [facility])  CULTURE, GROUP A STREP [facility])    EKG  EKG Interpretation None       Radiology Dg Chest 2 View  Result Date: 09/10/2016 CLINICAL DATA:  Cough and fever EXAM: CHEST  2 VIEW COMPARISON:  August 24, 2016 FINDINGS: The lungs are clear. Heart size and pulmonary vascularity are normal. No adenopathy. No pneumothorax. No bone lesions. IMPRESSION: No edema or consolidation. Electronically Signed   By: Bretta Bang III M.D.   On: 09/10/2016 09:16    Procedures Procedures (including critical care time)  Medications Ordered in ED Medications  0.9 %  sodium chloride infusion ( Intravenous Bolus  from Bag 09/10/16 1031)  oseltamivir (TAMIFLU) capsule 75 mg (not administered)  sodium chloride 0.9 % bolus 500 mL (500 mLs Intravenous New Bag/Given 09/10/16 0858)  ketorolac (TORADOL) 15 MG/ML injection 15 mg (15 mg Intravenous Given 09/10/16 0858)     Initial Impression / Assessment and Plan / ED Course  I have reviewed the triage vital signs and the nursing notes.  Pertinent labs & imaging results that were available during my care of the patient were reviewed by me and considered in my medical decision making (see chart for details).    Patients symptoms consistent with flulike illness. Also does have clearly a left otitis media. That were treated with amoxicillin. Patient symptoms just started on Wednesday so he still within the 48 hour window for Tamiflu. First dose Tamiflu given here because continue on Tamiflu. Chest  x-ray negative for pneumonia or strep negative for strep throat. Patient nontoxic no acute distress improved here with fluids and Toradol for the bodyaches. Will be treated symptomatically with Naprosyn and Mucinex DM. Work note provided.   Final Clinical Impressions(s) / ED Diagnoses   Final diagnoses:  Flu-like symptoms  Acute suppurative otitis media of left ear without spontaneous rupture of tympanic membrane, recurrence not specified    New Prescriptions New Prescriptions   AMOXICILLIN (AMOXIL) 500 MG CAPSULE    Take 1 capsule (500 mg total) by mouth 3 (three) times daily.   DEXTROMETHORPHAN-GUAIFENESIN (MUCINEX DM) 30-600 MG 12HR TABLET    Take 1 tablet by mouth 2 (two) times daily.   NAPROXEN (NAPROSYN) 500 MG TABLET    Take 1 tablet (500 mg total) by mouth 2 (two) times daily.   OSELTAMIVIR (TAMIFLU) 75 MG CAPSULE    Take 1 capsule (75 mg total) by mouth every 12 (twelve) hours.     Vanetta Mulders, MD 09/10/16 1043

## 2016-09-12 LAB — CULTURE, GROUP A STREP (THRC)

## 2016-09-28 ENCOUNTER — Encounter (HOSPITAL_COMMUNITY): Payer: Self-pay | Admitting: Emergency Medicine

## 2016-09-28 ENCOUNTER — Emergency Department (HOSPITAL_COMMUNITY)
Admission: EM | Admit: 2016-09-28 | Discharge: 2016-09-28 | Disposition: A | Discharge status: Home / Self Care | Payer: Medicare (Managed Care) | Attending: Emergency Medicine | Admitting: Emergency Medicine

## 2016-09-28 DIAGNOSIS — Z87891 Personal history of nicotine dependence: Secondary | ICD-10-CM | POA: Insufficient documentation

## 2016-09-28 DIAGNOSIS — G8929 Other chronic pain: Secondary | ICD-10-CM | POA: Insufficient documentation

## 2016-09-28 DIAGNOSIS — Z79899 Other long term (current) drug therapy: Secondary | ICD-10-CM | POA: Insufficient documentation

## 2016-09-28 DIAGNOSIS — M25561 Pain in right knee: Secondary | ICD-10-CM | POA: Diagnosis present

## 2016-09-28 DIAGNOSIS — I10 Essential (primary) hypertension: Secondary | ICD-10-CM | POA: Diagnosis not present

## 2016-09-28 MED ORDER — TRAMADOL HCL 50 MG PO TABS: 50.0000 mg | ORAL_TABLET | Freq: Four times a day (QID) | ORAL | 0 refills | Status: DC | PRN

## 2016-09-28 NOTE — Progress Notes (Signed)
Orthopedic Tech Progress Note Patient Details:  Chad Thornton [birth year] [id number]  Ortho Devices Type of Ortho Device: Knee Sleeve Ortho Device/Splint Interventions: Application   Saul Fordyce 09/28/2016, 5:30 PM

## 2016-09-28 NOTE — ED Provider Notes (Signed)
[facility] Provider Note   CSN: [id number] Arrival date & time: 09/28/16  1551  By signing my name below, I, Cynda Acres, attest that this documentation has been prepared under the direction and in the presence of Roxy Horseman, PA-C. Electronically Signed: Cynda Acres, Scribe. 09/28/16. 5:09 PM.  History   Chief Complaint Chief Complaint  Patient presents with  . Knee Pain   HPI Comments: Chad Thornton is a 61 y.o. male with a history of hypertension, who presents to the Emergency Department complaining of sudden-onset, constant right knee pain that began 4 days ago. Patient reports getting his knee drained every few months in [place]. Patient reports associated right knee swelling. No modifying factors indicated. Patient is ambulatory in the emergency room, but reports pain with ambulation. Patient does not have a primary orthopedist. Patient denies any fever, numbness, weakness, or tingling.   The history is provided by the patient. No language interpreter was used.    Past Medical History:  Diagnosis Date  . Bipolar 1 disorder (HCC)   . Hypertension     There are no active problems to display for this patient.   Past Surgical History:  Procedure Laterality Date  . BACK SURGERY     2000       Home Medications    Prior to Admission medications   Medication Sig Start Date End Date Taking? Authorizing Provider  acetaminophen (TYLENOL) 325 MG tablet Take 2 tablets (650 mg total) by mouth every 6 (six) hours as needed for mild pain, moderate pain or fever. Patient not taking: Reported on 09/10/2016 08/24/16   Everlene Farrier, PA-C  amoxicillin (AMOXIL) 500 MG capsule Take 1 capsule (500 mg total) by mouth 3 (three) times daily. 09/10/16   Vanetta Mulders, MD  benzonatate (TESSALON) 100 MG capsule Take 1 capsule (100 mg total) by mouth 3 (three) times daily as needed. Patient not taking: Reported on 09/10/2016 08/24/16   Everlene Farrier, PA-C  cetirizine (ZYRTEC  ALLERGY) 10 MG tablet Take 1 tablet (10 mg total) by mouth daily. 08/24/16   Everlene Farrier, PA-C  cyclobenzaprine (FLEXERIL) 10 MG tablet Take 1 tablet (10 mg total) by mouth 2 (two) times daily as needed for muscle spasms. 06/29/16   Francisco Manuel Bluefield, [place]  dextromethorphan-guaiFENesin (MUCINEX DM) 30-600 MG 12hr tablet Take 1 tablet by mouth 2 (two) times daily. 09/10/16   Vanetta Mulders, MD  divalproex (DEPAKOTE) 500 MG DR tablet Take 1,500 mg by mouth at bedtime.     Historical Provider, MD  fluticasone (FLONASE) 50 MCG/ACT nasal spray Place 2 sprays into both nostrils daily. 08/24/16   Everlene Farrier, PA-C  ibuprofen (ADVIL,MOTRIN) 600 MG tablet Take 1 tablet (600 mg total) by mouth 3 (three) times daily. Patient not taking: Reported on 09/10/2016 03/15/16   Bethel Born, PA-C  mirtazapine (REMERON) 30 MG tablet Take 30 mg by mouth at bedtime.     Historical Provider, MD  naproxen (NAPROSYN) 500 MG tablet Take 1 tablet (500 mg total) by mouth 2 (two) times daily. 09/10/16   Vanetta Mulders, MD  oseltamivir (TAMIFLU) 75 MG capsule Take 1 capsule (75 mg total) by mouth every 12 (twelve) hours. 09/10/16   Vanetta Mulders, MD  QUEtiapine (SEROQUEL) 400 MG tablet Take 400 mg by mouth at bedtime.    Historical Provider, MD    Family History No family history on file.  Social History Social History  Substance Use Topics  . Smoking status: Former Games developer  . Smokeless tobacco: Never Used  .  Alcohol use No     Allergies   Patient has no known allergies.   Review of Systems Review of Systems  Constitutional: Negative for fever.  Musculoskeletal: Positive for arthralgias (right knee) and joint swelling (right knee).  Neurological: Negative for weakness and numbness.     Physical Exam Updated Vital Signs BP (!) 161/85   Pulse 82   Temp 99 F (37.2 C)   Resp 20   Ht  (1.854 m)   Wt 267 lb (121.1 kg)   SpO2 96%   BMI 35.23 kg/m   Physical Exam  Physical Exam    Constitutional: Pt appears well-developed and well-nourished. No distress.  HENT:  Head: Normocephalic and atraumatic.  Eyes: Conjunctivae are normal.  Neck: Normal range of motion.  Cardiovascular: Normal rate, regular rhythm and intact distal pulses.   Capillary refill < 3 sec  Pulmonary/Chest: Effort normal and breath sounds normal.  Musculoskeletal: Pt exhibits no focal tenderness, but there does appear to be a small effusion, no redness, no evidence of septic joint or DVT. Pt exhibits no edema.  ROM: 4/5  Neurological: Pt  is alert. Coordination normal.  Sensation 5/5  Strength 4/5  Skin: Skin is warm and dry. Pt is not diaphoretic.  No tenting of the skin  Psychiatric: Pt has a normal mood and affect.  Nursing note and vitals reviewed.   ED Treatments / Results  DIAGNOSTIC STUDIES: Oxygen Saturation is 96% on RA, adequate by my interpretation.    COORDINATION OF CARE: 5:09 PM Discussed treatment plan with pt at bedside and pt agreed to plan, which includes Toradol  and an orthopedic follow-up.   Labs (all labs ordered are listed, but only abnormal results are displayed) Labs Reviewed - No data to display  EKG  EKG Interpretation None       Radiology No results found.  Procedures Procedures (including critical care time)  Medications Ordered in ED Medications - No data to display   Initial Impression / Assessment and Plan / ED Course  I have reviewed the triage vital signs and the nursing notes.  Pertinent labs & imaging results that were available during my care of the patient were reviewed by me and considered in my medical decision making (see chart for details).     Patient X-Ray negative for obvious fracture or dislocation.  Pt advised to follow up with orthopedics. Patient given knee sleeve while in ED, conservative therapy recommended and discussed. Patient will be discharged home & is agreeable with above plan. Returns precautions discussed. Pt  appears safe for discharge.   Final Clinical Impressions(s) / ED Diagnoses   Final diagnoses:  Chronic pain of right knee    New Prescriptions Discharge Medication List as of 09/28/2016  5:20 PM    START taking these medications   Details  traMADol (ULTRAM) 50 MG tablet Take 1 tablet (50 mg total) by mouth every 6 (six) hours as needed., Starting Tue 09/28/2016, Print       I personally performed the services described in this documentation, which was scribed in my presence. The recorded information has been reviewed and is accurate.      Roxy Horseman, PA-C 09/28/16 1735    Margarita Grizzle, MD 09/28/16 4098

## 2016-09-28 NOTE — ED Notes (Signed)
Pt reports his previous orthopedic that drained his knee last time lives in [place]. He reports he moved here in August and does not have orthopedic MD here.

## 2016-09-28 NOTE — ED Notes (Signed)
ED Provider at bedside. 

## 2016-09-28 NOTE — ED Triage Notes (Signed)
Pt reports swelling and pain to right knee x4 days, denies injury. Reports hx of same. Ambulatory to triage. A/ox4, resp e/u, nad.

## 2018-03-15 ENCOUNTER — Emergency Department (HOSPITAL_COMMUNITY)
Admission: EM | Admit: 2018-03-15 | Discharge: 2018-03-15 | Disposition: A | Discharge status: Home / Self Care | Payer: Medicare Other | Attending: Emergency Medicine | Admitting: Emergency Medicine

## 2018-03-15 ENCOUNTER — Encounter (HOSPITAL_COMMUNITY): Payer: Self-pay

## 2018-03-15 ENCOUNTER — Emergency Department (HOSPITAL_COMMUNITY): Payer: Medicare Other

## 2018-03-15 DIAGNOSIS — R350 Frequency of micturition: Secondary | ICD-10-CM | POA: Insufficient documentation

## 2018-03-15 DIAGNOSIS — M25511 Pain in right shoulder: Secondary | ICD-10-CM | POA: Diagnosis present

## 2018-03-15 DIAGNOSIS — I10 Essential (primary) hypertension: Secondary | ICD-10-CM | POA: Insufficient documentation

## 2018-03-15 DIAGNOSIS — Z79899 Other long term (current) drug therapy: Secondary | ICD-10-CM | POA: Diagnosis not present

## 2018-03-15 DIAGNOSIS — Z87891 Personal history of nicotine dependence: Secondary | ICD-10-CM | POA: Diagnosis not present

## 2018-03-15 LAB — URINALYSIS, ROUTINE W REFLEX MICROSCOPIC
Bacteria, UA: NONE SEEN
Bilirubin Urine: NEGATIVE
Glucose, UA: NEGATIVE mg/dL
Ketones, ur: NEGATIVE mg/dL
Leukocytes, UA: NEGATIVE
Nitrite: NEGATIVE
Protein, ur: NEGATIVE mg/dL
Specific Gravity, Urine: 1.023 (ref 1.005–1.030)
pH: 5 (ref 5.0–8.0)

## 2018-03-15 MED ORDER — TAMSULOSIN HCL 0.4 MG PO CAPS: 0.4000 mg | ORAL_CAPSULE | Freq: Every day | ORAL | 0 refills | Status: DC

## 2018-03-15 MED ORDER — NAPROXEN 250 MG PO TABS
500.0000 mg | ORAL_TABLET | Freq: Once | ORAL | Status: AC
Start: 2018-03-15 — End: 2018-03-15
  Administered 2018-03-15: 500 mg via ORAL
  Filled 2018-03-15: qty 2

## 2018-03-15 MED ORDER — MELOXICAM 15 MG PO TABS: 15.0000 mg | ORAL_TABLET | Freq: Every day | ORAL | 0 refills | Status: DC

## 2018-03-15 NOTE — ED Provider Notes (Signed)
[facility] EMERGENCY DEPARTMENT Provider Note   CSN: [id number] Arrival date & time: 03/15/18  1725     History   Chief Complaint Chief Complaint  Patient presents with  . Shoulder Pain  . Urinary Frequency    HPI Chad Thornton is a 62 y.o. male who presents to the emergency department with complaint of right shoulder pain and urinary frequency.  Patient just moved to the area from [facility].  He has not yet set up primary care.  He states that he is been having some pain in his right shoulder which is worse with movement and he points to the right AC joint.  He denies numbness tingling weakness or injury.  He has not taken anything for the pain.  Patient also complains that he has urinary frequency at night and difficulty starting his stream.  He has a history of prostate hypertrophy.  He denies back pain, fevers, chills, foul odor of urine or dysuria.  HPI  Past Medical History:  Diagnosis Date  . Bipolar 1 disorder (HCC)   . Hypertension     There are no active problems to display for this patient.   Past Surgical History:  Procedure Laterality Date  . BACK SURGERY     2000        Home Medications    Prior to Admission medications   Medication Sig Start Date End Date Taking? Authorizing Provider  acetaminophen (TYLENOL) 325 MG tablet Take 2 tablets (650 mg total) by mouth every 6 (six) hours as needed for mild pain, moderate pain or fever. Patient not taking: Reported on 09/10/2016 08/24/16   Everlene Farrier, PA-C  amoxicillin (AMOXIL) 500 MG capsule Take 1 capsule (500 mg total) by mouth 3 (three) times daily. 09/10/16   Vanetta Mulders, MD  benzonatate (TESSALON) 100 MG capsule Take 1 capsule (100 mg total) by mouth 3 (three) times daily as needed. Patient not taking: Reported on 09/10/2016 08/24/16   Everlene Farrier, PA-C  cetirizine (ZYRTEC ALLERGY) 10 MG tablet Take 1 tablet (10 mg total) by mouth daily. 08/24/16   Everlene Farrier,  PA-C  cyclobenzaprine (FLEXERIL) 10 MG tablet Take 1 tablet (10 mg total) by mouth 2 (two) times daily as needed for muscle spasms. 06/29/16   Alvina Chou, PA  dextromethorphan-guaiFENesin (MUCINEX DM) 30-600 MG 12hr tablet Take 1 tablet by mouth 2 (two) times daily. 09/10/16   Vanetta Mulders, MD  divalproex (DEPAKOTE) 500 MG DR tablet Take 1,500 mg by mouth at bedtime.     [provider]  fluticasone (FLONASE) 50 MCG/ACT nasal spray Place 2 sprays into both nostrils daily. 08/24/16   Everlene Farrier, PA-C  ibuprofen (ADVIL,MOTRIN) 600 MG tablet Take 1 tablet (600 mg total) by mouth 3 (three) times daily. Patient not taking: Reported on 09/10/2016 03/15/16   Bethel Born, PA-C  mirtazapine (REMERON) 30 MG tablet Take 30 mg by mouth at bedtime.     [provider]  naproxen (NAPROSYN) 500 MG tablet Take 1 tablet (500 mg total) by mouth 2 (two) times daily. 09/10/16   Vanetta Mulders, MD  oseltamivir (TAMIFLU) 75 MG capsule Take 1 capsule (75 mg total) by mouth every 12 (twelve) hours. 09/10/16   Vanetta Mulders, MD  QUEtiapine (SEROQUEL) 400 MG tablet Take 400 mg by mouth at bedtime.    [provider]  traMADol (ULTRAM) 50 MG tablet Take 1 tablet (50 mg total) by mouth every 6 (six) hours as needed. 09/28/16   Dahlia Client,  Molly Maduroobert, PA-C    Family History No family history on file.  Social History Social History   Tobacco Use  . Smoking status: Former Games developermoker  . Smokeless tobacco: Never Used  Substance Use Topics  . Alcohol use: No  . 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   Review of Systems Review of Systems Ten systems reviewed and are negative for acute change, except as noted in the HPI.    Physical Exam Updated Vital Signs BP (!) 162/93   Pulse 81   Temp 99.3 F (37.4 C) (Oral)   Resp 18   Ht 6\' 1"  (1.854 m)   Wt 100.7 kg   SpO2 98%   BMI 29.29 kg/m    Physical Exam  Constitutional: He is oriented to person, place, and time. He appears well-developed and well-nourished. No distress.  HENT:  Head: Normocephalic and atraumatic.  Eyes: Conjunctivae are normal. No scleral icterus.  Neck: Normal range of motion. Neck supple.  Cardiovascular: Normal rate, regular rhythm and normal heart sounds.  Pulmonary/Chest: Effort normal and breath sounds normal. No respiratory distress.  Abdominal: Soft. There is no tenderness.  No CVA tenderness   Musculoskeletal: He exhibits no edema.  Full range of motion and strength of the right glenohumeral joint.  Crossarm test, tenderness of [facility] at the [facility].  Neurological: He is alert and oriented to person, place, and time.  Skin: Skin is warm and dry. He is not diaphoretic.  Psychiatric: His behavior is normal.  Nursing note and vitals reviewed.    ED Treatments / Results  Labs (all labs ordered are listed, but only abnormal results are displayed) Labs Reviewed  URINALYSIS, ROUTINE W REFLEX MICROSCOPIC - Abnormal; Notable for the following components:      Result Value   Hgb urine dipstick MODERATE (*)    All other components within normal limits    EKG None  Radiology Dg Shoulder Right  Result Date: 03/15/2018 CLINICAL DATA:  Right shoulder pain EXAM: RIGHT SHOULDER - 2+ VIEW COMPARISON:  None. FINDINGS: Mild degenerative change of the [facility] joint and glenohumeral interval. There is no fracture or malalignment. Probable cysts in the glenoid. IMPRESSION: Mild arthritis.  No acute osseous abnormality Electronically Signed   By: Jasmine PangKim  Fujinaga M.D.   On: 03/15/2018 18:28    Procedures Procedures (including critical care time)  Medications Ordered in ED Medications - No data to display   Initial Impression / Assessment and Plan / ED Course  I have reviewed the triage vital signs and the nursing notes.  Pertinent labs & imaging results that were available during my care of the patient  were reviewed by me and considered in my medical decision making (see chart for details).     Patient with right shoulder pain, pain at the [facility] joint.  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  Patient will be given some anti-inflammatory medications.  He is also having urinary frequency.  Hemoglobin is present but there are no red blood cells.  I think this is likely false positive.  I have very low suspicion for myoglobin/rhabdo as the cause as he has no muscle pain or other reason to have rhabdomyolysis.  He is not taking any statins.  Patient will be discharged with Flomax and naproxen.  He may follow-up with her primary  care physician.  He appears appropriate for discharge at this time  Final Clinical Impressions(s) / ED Diagnoses   Final diagnoses:  Acute pain of right shoulder  Urinary frequency    ED Discharge Orders    None       Arthor Captain, PA-C 03/15/18 2119    Tegeler, Canary Brim, MD 03/16/18 [phone]

## 2018-03-15 NOTE — ED Provider Notes (Signed)
Patient placed in Quick Look pathway, seen and evaluated   Chief Complaint: Shoulder pain and urinary frequency  HPI:   Right shoulder pain started a few days ago, no known injury. No falls or heavy lifting.  Also having more frequent urination at night time for the past week or two. No malodor. No burning. Has been told he has a large prostate before.   ROS: No hematuria  Physical Exam:   Gen: No distress  Neuro: Awake and Alert  Skin: Warm    Focused Exam: Right shoulder tender over proximal humerus. No clavicular or scapular spine tenderness. No swelling or break in skin. Radial pulses 2+ bilaterally. Sensation to light touch intact in fingers.    Initiation of care has begun. The patient has been counseled on the process, plan, and necessity for staying for the completion/evaluation, and the remainder of the medical screening examination    Lawrence MarseillesShrosbree, [place] Torok J, PA-C 03/15/18 1739    Vanetta MuldersZackowski, Scott, MD 03/17/18 2003

## 2018-03-15 NOTE — ED Notes (Signed)
Patient verbalizes understanding of discharge instructions. Opportunity for questioning and answers were provided. Armband removed by staff, pt discharged from ED.  

## 2018-03-15 NOTE — Discharge Instructions (Signed)
Get help right away if: Your arm, hand, or fingers: Tingle. Become numb. Become swollen. Become painful. Turn white or blue. Get help right away if: You have a fever or chills. You suddenly cannot urinate. You feel lightheaded, or very dizzy, or you faint. There are large amounts of blood or clots in the urine. Your urinary problems become hard to manage. You develop moderate to severe low back or flank pain. The flank is the side of your body between the ribs and the hip.

## 2018-03-15 NOTE — ED Triage Notes (Signed)
Pt presents for evaluation of R shoulder pain x 3 days with no injury. Patient reports he has frequency of urination at night, states some pain in lower back x 2-3 weeks.

## 2018-06-13 ENCOUNTER — Other Ambulatory Visit: Payer: Self-pay | Admitting: Internal Medicine

## 2018-06-13 DIAGNOSIS — R110 Nausea: Secondary | ICD-10-CM

## 2018-06-30 ENCOUNTER — Ambulatory Visit
Admission: RE | Admit: 2018-06-30 | Discharge: 2018-06-30 | Disposition: A | Discharge status: Home / Self Care | Payer: Medicare Other | Source: Ambulatory Visit | Attending: Internal Medicine | Admitting: Internal Medicine

## 2018-06-30 DIAGNOSIS — R110 Nausea: Secondary | ICD-10-CM

## 2018-07-02 IMAGING — CR DG CHEST 2V
2 series · 2 of 2 positions shown · non-contrast
Comparison: August 24, 2016

CLINICAL DATA: Cough and fever

EXAM:
CHEST  2 VIEW

[chest pa]
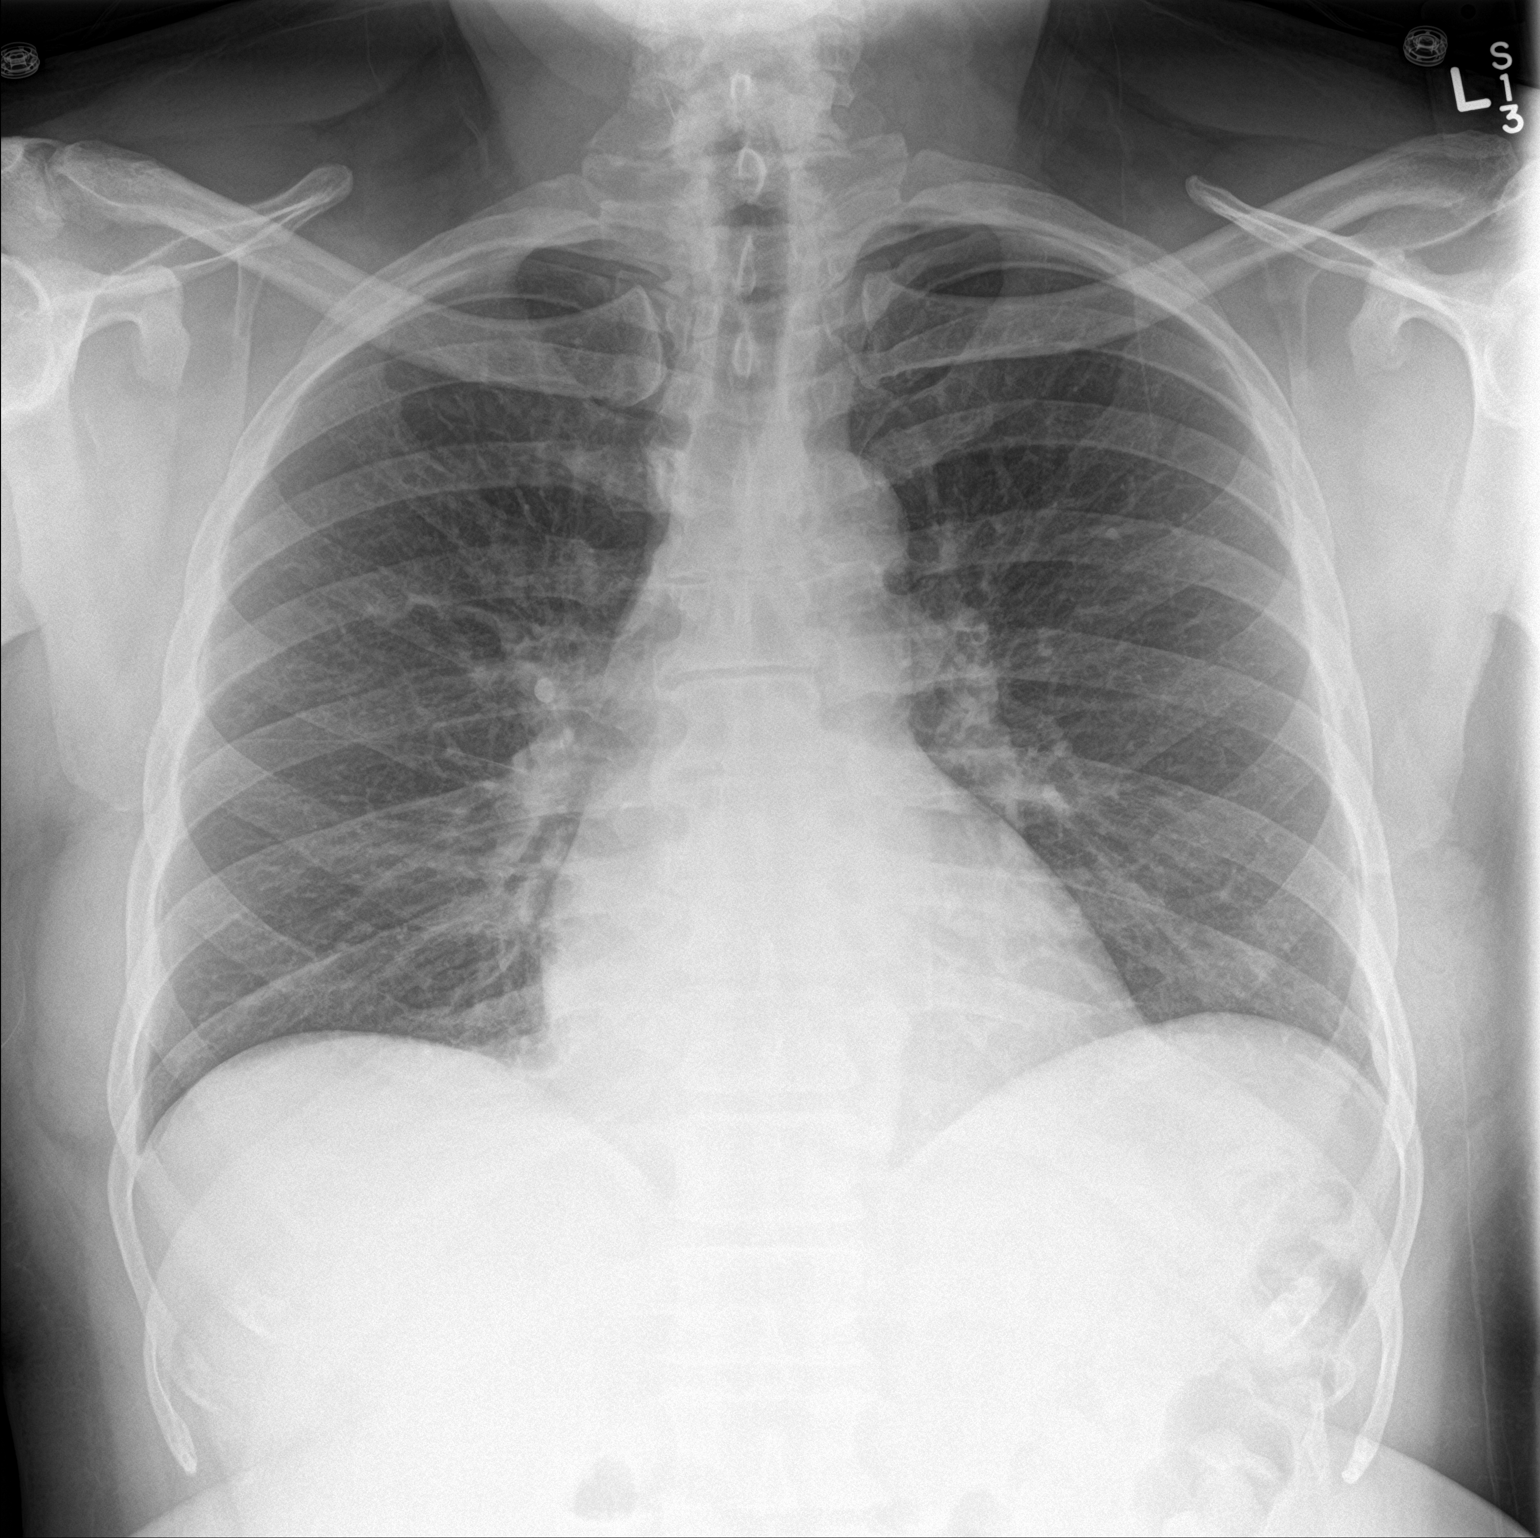

[chest lat]
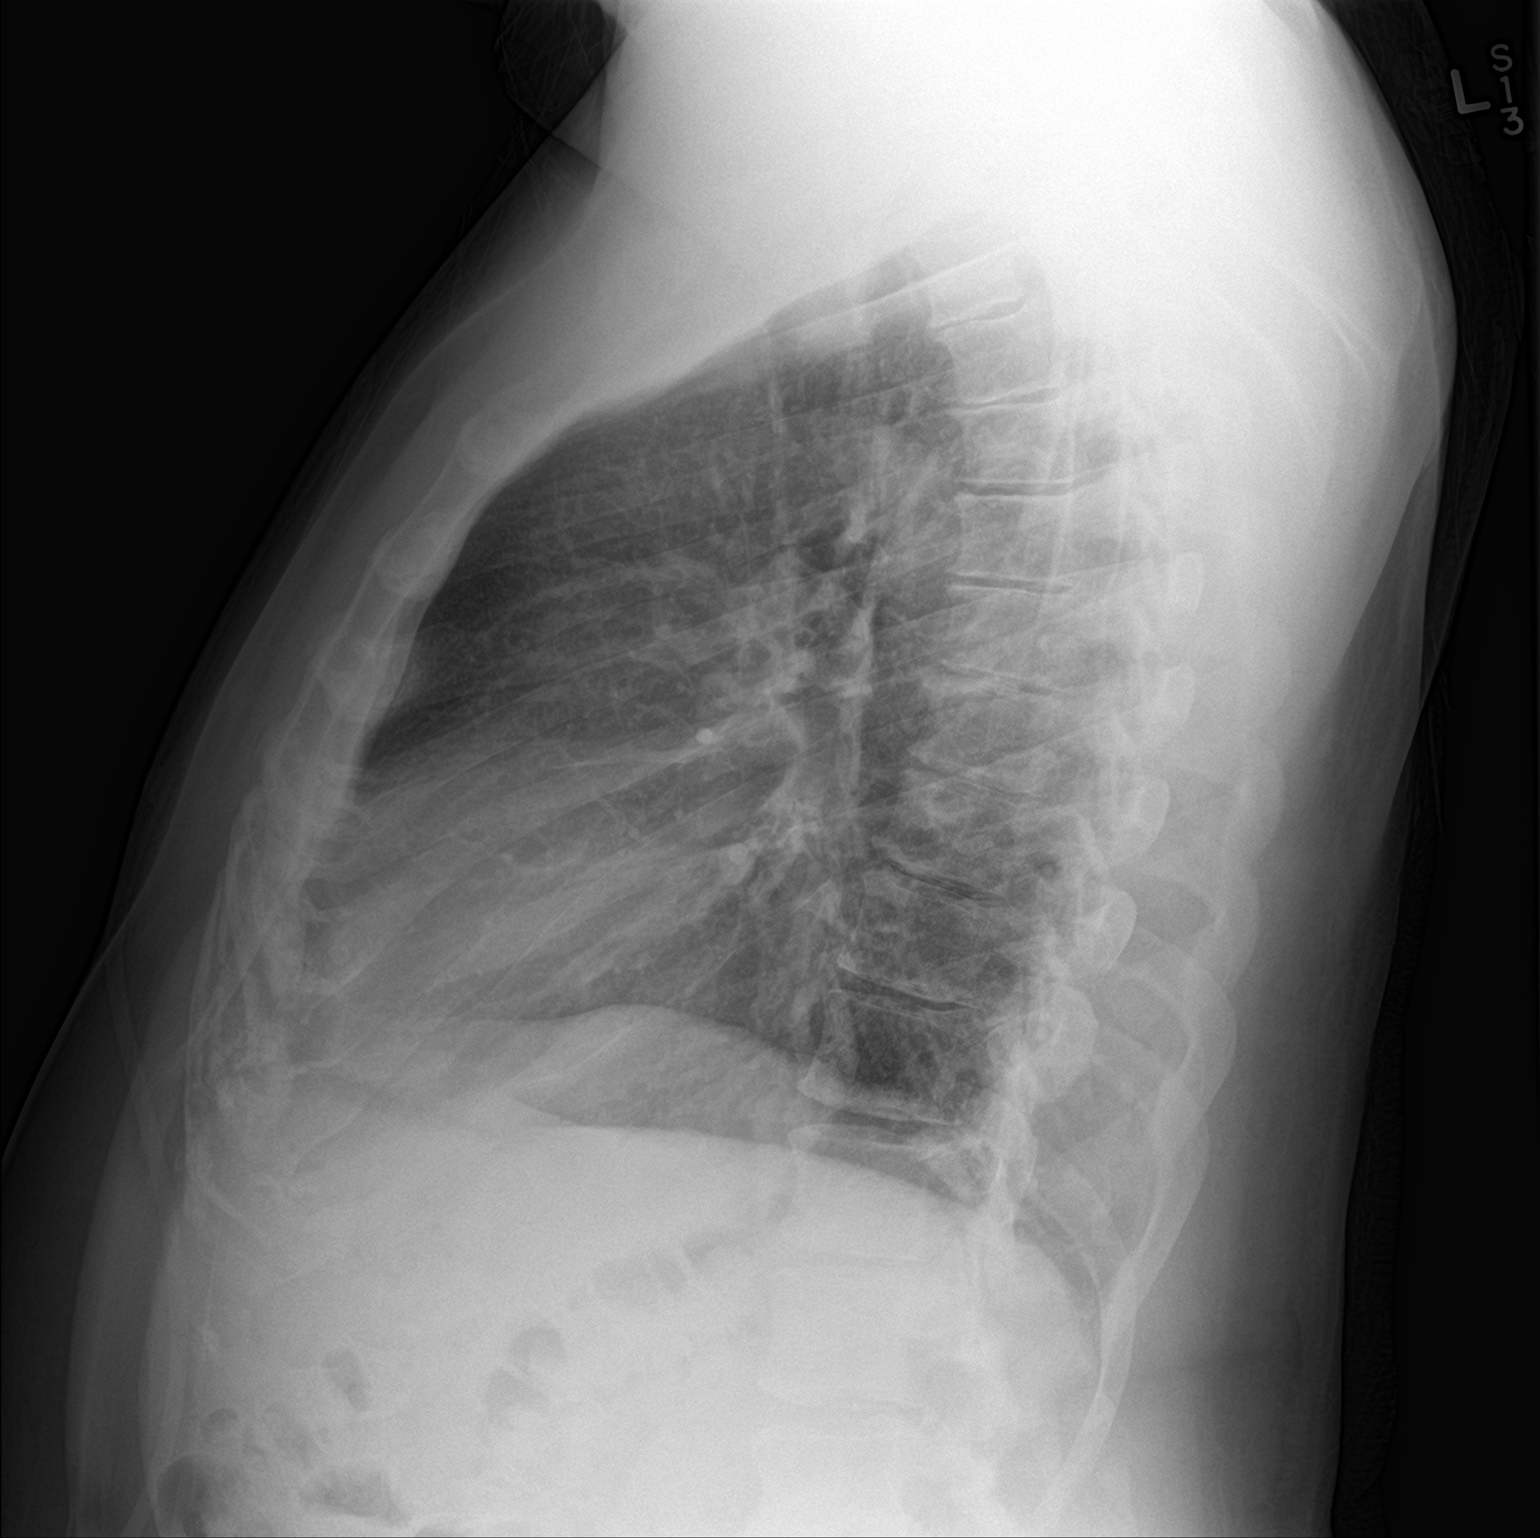

[2 of 2 positions shown; findings below may reference images not displayed]

FINDINGS: The lungs are clear. Heart size and pulmonary vascularity are
normal. No adenopathy. No pneumothorax. No bone lesions.
IMPRESSION: No edema or consolidation.

## 2018-07-07 ENCOUNTER — Encounter (HOSPITAL_COMMUNITY): Payer: Self-pay

## 2018-07-07 ENCOUNTER — Ambulatory Visit (HOSPITAL_COMMUNITY)
Admission: EM | Admit: 2018-07-07 | Discharge: 2018-07-07 | Disposition: A | Discharge status: Home / Self Care | Payer: Medicare Other | Attending: Internal Medicine | Admitting: Internal Medicine

## 2018-07-07 ENCOUNTER — Ambulatory Visit (INDEPENDENT_AMBULATORY_CARE_PROVIDER_SITE_OTHER): Payer: Medicare Other

## 2018-07-07 DIAGNOSIS — R0981 Nasal congestion: Secondary | ICD-10-CM

## 2018-07-07 DIAGNOSIS — R05 Cough: Secondary | ICD-10-CM | POA: Diagnosis not present

## 2018-07-07 DIAGNOSIS — R0989 Other specified symptoms and signs involving the circulatory and respiratory systems: Secondary | ICD-10-CM

## 2018-07-07 DIAGNOSIS — R69 Illness, unspecified: Secondary | ICD-10-CM | POA: Insufficient documentation

## 2018-07-07 DIAGNOSIS — J111 Influenza due to unidentified influenza virus with other respiratory manifestations: Secondary | ICD-10-CM

## 2018-07-07 DIAGNOSIS — R509 Fever, unspecified: Secondary | ICD-10-CM | POA: Diagnosis not present

## 2018-07-07 DIAGNOSIS — R062 Wheezing: Secondary | ICD-10-CM | POA: Diagnosis not present

## 2018-07-07 DIAGNOSIS — R51 Headache: Secondary | ICD-10-CM

## 2018-07-07 DIAGNOSIS — M7918 Myalgia, other site: Secondary | ICD-10-CM

## 2018-07-07 MED ORDER — BENZONATATE 200 MG PO CAPS: 200.0000 mg | ORAL_CAPSULE | Freq: Three times a day (TID) | ORAL | 0 refills | Status: DC | PRN

## 2018-07-07 MED ORDER — IPRATROPIUM-ALBUTEROL 0.5-2.5 (3) MG/3ML IN SOLN
RESPIRATORY_TRACT | Status: AC
  Filled 2018-07-07: qty 3

## 2018-07-07 MED ORDER — ALBUTEROL SULFATE HFA 108 (90 BASE) MCG/ACT IN AERS: 1.0000 | INHALATION_SPRAY | RESPIRATORY_TRACT | 0 refills | Status: AC | PRN

## 2018-07-07 MED ORDER — OSELTAMIVIR PHOSPHATE 75 MG PO CAPS: 75.0000 mg | ORAL_CAPSULE | Freq: Two times a day (BID) | ORAL | 0 refills | Status: DC

## 2018-07-07 MED ORDER — IPRATROPIUM-ALBUTEROL 0.5-2.5 (3) MG/3ML IN SOLN
3.0000 mL | Freq: Once | RESPIRATORY_TRACT | Status: AC
  Administered 2018-07-07: 3 mL via RESPIRATORY_TRACT

## 2018-07-07 NOTE — ED Triage Notes (Signed)
Pt c/o productive cough with thick yellow sputum and pain to lower back when coughing and causing SOB with fever, head and nasal congestion x2days. Took OTC meds with no relief

## 2018-07-07 NOTE — ED Provider Notes (Signed)
[facility]    CSN: [id number] Arrival date & time: 07/07/18  8032     History   Chief Complaint Chief Complaint  Patient presents with  . Cough    HPI Chad Thornton is a 63 y.o. male.   He presents today with frequent wheezy/productive cough, tactile temperature, prostration, last evening.  Had some scratchy throat and runny/congested nose starting in the day or 2 before that.  Hurts to cough in the ribs, makes headache worse to cough.  Achiness.  No nausea/vomiting, no diarrhea. Has been taking Alka-Seltzer products and TheraFlu. Has not had a flu shot.  Does not smoke. Missed church last night because of symptoms.  Would like a work note, works at [employer].    HPI  Past Medical History:  Diagnosis Date  . Bipolar 1 disorder (HCC)   . Hypertension      Past Surgical History:  Procedure Laterality Date  . BACK SURGERY     2000       Home Medications    Prior to Admission medications   Medication Sig Start Date End Date Taking? Authorizing Provider  albuterol (PROVENTIL HFA;VENTOLIN HFA) 108 (90 Base) MCG/ACT inhaler Inhale 1-2 puffs into the lungs every 4 (four) hours as needed for wheezing or shortness of breath. 07/07/18   Isa Rankin, MD  benzonatate (TESSALON) 200 MG capsule Take 1 capsule (200 mg total) by mouth 3 (three) times daily as needed for cough. 07/07/18   Isa Rankin, MD  divalproex (DEPAKOTE) 500 MG DR tablet Take 1,500 mg by mouth at bedtime.     [provider]  ibuprofen (ADVIL,MOTRIN) 600 MG tablet Take 1 tablet (600 mg total) by mouth 3 (three) times daily. Patient not taking: Reported on 09/10/2016 03/15/16   Bethel Born, PA-C  mirtazapine (REMERON) 30 MG tablet Take 30 mg by mouth at bedtime.     [provider]  oseltamivir (TAMIFLU) 75 MG capsule Take 1 capsule (75 mg total) by mouth every 12 (twelve) hours. 07/07/18   Isa Rankin, MD  QUEtiapine (SEROQUEL) 400 MG tablet  Take 400 mg by mouth at bedtime.    [provider]    Family History No family history on file.  Social History Social History   Tobacco Use  . Smoking status: Former Games developer  . Smokeless tobacco: Never Used  Substance Use Topics  . Alcohol use: No  . 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   Review of Systems Review of Systems  All other systems reviewed and are negative.    Physical Exam Triage Vital Signs ED Triage Vitals  Enc Vitals Group     BP 07/07/18 0943 (!) 185/103     Pulse Rate 07/07/18 0943 98     Resp 07/07/18 0943 18     Temp 07/07/18 0943 99.7 F (37.6 C)     Temp Source 07/07/18 0943 Oral     SpO2 07/07/18 0943 92 %     Weight --      Height --      Pain Score 07/07/18 0947 10     Pain Loc --    Updated Vital Signs BP (!) 185/103 (BP Location: Right Arm)   Pulse 98   Temp 99.7 F (37.6 C) (Oral)   Resp 18   SpO2 92%  Physical Exam Vitals signs and nursing  note reviewed.  Constitutional:      Comments: Alert, nicely groomed Sitting up on the exam table Looks ill but not toxic, frequent wheezy cough during exam  HENT:     Head: Atraumatic.     Comments: Right TM is dull and red, left TM is dull Marked nasal congestion bilaterally, with mucopurulent material present Mucous membranes are little dry Eyes:     Comments: Conjugate gaze, no eye redness/drainage  Neck:     Musculoskeletal: Neck supple.  Cardiovascular:     Rate and Rhythm: Normal rate and regular rhythm.  Pulmonary:     Effort: No respiratory distress.     Breath sounds: No wheezing or rales.     Comments: Coarse breath sounds throughout, diminished in the right posterior lung base Abdominal:     General: There is no distension.  Musculoskeletal: Normal range of motion.  Skin:    General: Skin is warm and dry.     Comments: No cyanosis  Neurological:     Mental Status: He is alert and  oriented to person, place, and time.      UC Treatments / Results   Radiology EXAM: CHEST - 2 VIEW  COMPARISON:  09/10/2016  FINDINGS: Normal heart size, mediastinal contours, and pulmonary vascularity.  Atherosclerotic calcification aorta.  Lungs clear.  No pulmonary infiltrate, pleural effusion or pneumothorax.  Bones unremarkable.  IMPRESSION: No acute abnormalities.   Electronically Signed   By: Ulyses Southward M.D.   On: 07/07/2018 10:36        Procedures Procedures (including critical care time)  Medications Ordered in UC Medications  ipratropium-albuterol (DUONEB) 0.5-2.5 (3) MG/3ML nebulizer solution 3 mL (3 mLs Nebulization Given 07/07/18 1034)    Final Clinical Impressions(s) / UC Diagnoses   Final diagnoses:  Influenza-like illness     Discharge Instructions     Symptoms today seem most consistent with the flu.  Rest and push fluids.  Note for work for today and tomorrow.  Prescriptions for oseltamivir (flu medicine/helps with decreasing fever) and benzonatate (for cough) and albuterol inhaler (for cough/chest tightness) were sent to the pharmacy.  Chest xray at the urgent care did not show any pneumonia.  Anticipate gradual improvement in cough, achiness, and well being over the next several days.  Recheck for persistent (>3 more days) fever >100.5, increasing phlegm production/nasal discharge, or if not starting to improve in a few days.       ED Prescriptions    Medication Sig Dispense Auth. Provider   oseltamivir (TAMIFLU) 75 MG capsule Take 1 capsule (75 mg total) by mouth every 12 (twelve) hours. 10 capsule Isa Rankin, MD   benzonatate (TESSALON) 200 MG capsule Take 1 capsule (200 mg total) by mouth 3 (three) times daily as needed for cough. 30 capsule Isa Rankin, MD   albuterol (PROVENTIL HFA;VENTOLIN HFA) 108 (90 Base) MCG/ACT inhaler Inhale 1-2 puffs into the lungs every 4 (four) hours as needed for wheezing or  shortness of breath. 1 Inhaler Isa Rankin, MD        Isa Rankin, MD 07/09/18 2215

## 2018-07-07 NOTE — Discharge Instructions (Addendum)
Symptoms today seem most consistent with the flu.  Rest and push fluids.  Note for work for today and tomorrow.  Prescriptions for oseltamivir (flu medicine/helps with decreasing fever) and benzonatate (for cough) and albuterol inhaler (for cough/chest tightness) were sent to the pharmacy.  Chest xray at the urgent care did not show any pneumonia.  Anticipate gradual improvement in cough, achiness, and well being over the next several days.  Recheck for persistent (>3 more days) fever >100.5, increasing phlegm production/nasal discharge, or if not starting to improve in a few days.

## 2018-08-15 ENCOUNTER — Ambulatory Visit (HOSPITAL_COMMUNITY)
Admission: EM | Admit: 2018-08-15 | Discharge: 2018-08-15 | Disposition: A | Discharge status: Home / Self Care | Payer: Medicare Other | Attending: Family Medicine | Admitting: Family Medicine

## 2018-08-15 ENCOUNTER — Encounter (HOSPITAL_COMMUNITY): Payer: Self-pay

## 2018-08-15 DIAGNOSIS — R040 Epistaxis: Secondary | ICD-10-CM | POA: Diagnosis not present

## 2018-08-15 MED ORDER — SALINE SPRAY 0.65 % NA SOLN: 1.0000 | NASAL | 0 refills | Status: AC | PRN

## 2018-08-15 NOTE — Discharge Instructions (Addendum)
You may use Ocean saline nasal spray as needed to help moisten nasal passages. You can also use vaseline inside your nose to help with any irritation and a humidifier to help moisten the air where you live. If you develop another nose bleed, hold pressure and apply ice pack to bridge of nose to help stop the bleeding. You may also use OTC afrin spray to help stop your nose bleed.

## 2018-08-15 NOTE — ED Provider Notes (Signed)
[facility]    CSN: [id number] Arrival date & time: 08/15/18  1342     History   Chief Complaint Chief Complaint  Patient presents with  . Nose Bleed    HPI Chad Thornton is a 63 y.o. male.   HPI Patient reports two episodes of epistaxis over the past three weeks. He reports that bleeding occurred only from the Left nare and lasted approximately five minutes each time. He reports the bleeding subsided on its own. He denies any sneezing, sinus pressure or pain, ear pain, sore throat or cough. He reports that he lives at [facility] house and there has been some recent mold that they cleaned with bleach. He was concerned this could have caused his nose to bleed. He denies any known precipitating factors and has not used any medications for his symptoms. No history of nose bleeds in the past.   Past Medical History:  Diagnosis Date  . Bipolar 1 disorder (HCC)   . Hypertension     There are no active problems to display for this patient.   Past Surgical History:  Procedure Laterality Date  . BACK SURGERY     2000       Home Medications    Prior to Admission medications   Medication Sig Start Date End Date Taking? Authorizing Provider  albuterol (PROVENTIL HFA;VENTOLIN HFA) 108 (90 Base) MCG/ACT inhaler Inhale 1-2 puffs into the lungs every 4 (four) hours as needed for wheezing or shortness of breath. 07/07/18   Isa Rankin, MD  benzonatate (TESSALON) 200 MG capsule Take 1 capsule (200 mg total) by mouth 3 (three) times daily as needed for cough. 07/07/18   Isa Rankin, MD  divalproex (DEPAKOTE) 500 MG DR tablet Take 1,500 mg by mouth at bedtime.     [provider]  ibuprofen (ADVIL,MOTRIN) 600 MG tablet Take 1 tablet (600 mg total) by mouth 3 (three) times daily. Patient not taking: Reported on 09/10/2016 03/15/16   Bethel Born, PA-C  mirtazapine (REMERON) 30 MG tablet Take 30 mg by mouth at bedtime.     [provider]    oseltamivir (TAMIFLU) 75 MG capsule Take 1 capsule (75 mg total) by mouth every 12 (twelve) hours. 07/07/18   Isa Rankin, MD  QUEtiapine (SEROQUEL) 400 MG tablet Take 400 mg by mouth at bedtime.    [provider]  sodium chloride (OCEAN) 0.65 % SOLN nasal spray Place 1 spray into both nostrils as needed (nasal passage irritation). 08/15/18   Eustace Moore, MD    Family History History reviewed. No pertinent family history.  Social History Social History   Tobacco Use  . Smoking status: Former Games developer  . Smokeless tobacco: Never Used  Substance Use Topics  . Alcohol use: No  . 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   Review of Systems Review of Systems  Constitutional: Negative for chills and fever.  HENT: Positive for nosebleeds. Negative for congestion, ear pain, sinus pressure, sinus pain, sneezing and sore throat.   Eyes: Negative for pain and visual disturbance.  Respiratory: Negative for cough and shortness of breath.   Cardiovascular: Negative for chest pain and palpitations.  Gastrointestinal: Negative for abdominal pain and vomiting.  Genitourinary: Negative for dysuria and hematuria.  Musculoskeletal: Negative for arthralgias and back pain.  Skin: Negative for color change and rash.  Neurological: Negative  for seizures and syncope.  All other systems reviewed and are negative.    Physical Exam Triage Vital Signs ED Triage Vitals  Enc Vitals Group     BP 08/15/18 1425 (!) 157/86     Pulse Rate 08/15/18 1425 78     Resp 08/15/18 1425 17     Temp 08/15/18 1425 99.9 F (37.7 C)     Temp Source 08/15/18 1425 Oral     SpO2 08/15/18 1425 97 %     Weight --      Height --      Head Circumference --      Peak Flow --      Pain Score 08/15/18 1427 0     Pain Loc --      Pain Edu? --      Excl. in GC? --    No data found.  Updated Vital Signs BP (!) 157/86 (BP  Location: Right Arm)   Pulse 78   Temp 99.9 F (37.7 C) (Oral)   Resp 17   SpO2 97%   Visual Acuity Right Eye Distance:   Left Eye Distance:   Bilateral Distance:    Right Eye Near:   Left Eye Near:    Bilateral Near:     Physical Exam Constitutional:      General: He is not in acute distress.    Appearance: He is well-developed.  HENT:     Head: Normocephalic and atraumatic.     Nose:     Left Nostril: Epistaxis present.  Eyes:     Conjunctiva/sclera: Conjunctivae normal.     Pupils: Pupils are equal, round, and reactive to light.  Neck:     Musculoskeletal: Normal range of motion.  Cardiovascular:     Rate and Rhythm: Normal rate.  Pulmonary:     Effort: Pulmonary effort is normal. No respiratory distress.  Abdominal:     General: There is no distension.     Palpations: Abdomen is soft.  Musculoskeletal: Normal range of motion.  Skin:    General: Skin is warm and dry.  Neurological:     Mental Status: He is alert.      UC Treatments / Results  Labs (all labs ordered are listed, but only abnormal results are displayed) Labs Reviewed - No data to display  EKG None  Radiology No results found.  Procedures Procedures (including critical care time)  Medications Ordered in UC Medications - No data to display  Initial Impression / Assessment and Plan / UC Course  I have reviewed the triage vital signs and the nursing notes.  Pertinent labs & imaging results that were available during my care of the patient were reviewed by me and considered in my medical decision making (see chart for details).      Discussed with patient that the mold in his home is not causing problems.  Bleach is an irritant but would not cause nosebleeds.  It is mostly the dry heaves and this time of year that causes people to have intermittent nosebleeding.  Prevention is the key.  Return as needed  Final Clinical Impressions(s) / UC Diagnoses   Final diagnoses:  Epistaxis      Discharge Instructions     You may use Ocean saline nasal spray as needed to help moisten nasal passages. You can also use vaseline inside your nose to help with any irritation and a humidifier to help moisten the air where you live. If you develop another nose bleed,  hold pressure and apply ice pack to bridge of nose to help stop the bleeding. You may also use OTC afrin spray to help stop your nose bleed.      ED Prescriptions    Medication Sig Dispense Auth. Provider   sodium chloride (OCEAN) 0.65 % SOLN nasal spray Place 1 spray into both nostrils as needed (nasal passage irritation). 30 mL Eustace MooreNelson, Ginny Loomer Sue, MD     Controlled Substance Prescriptions [place] Controlled Substance Registry consulted? Not Applicable   Eustace MooreNelson, Gatsby Chismar Sue, MD 08/15/18 1517

## 2018-08-15 NOTE — ED Triage Notes (Signed)
Pt presents with nose bleeds in the past 2 weeks not associated with any particular event or injury.

## 2018-08-30 NOTE — Progress Notes (Signed)
Patient referred by Jarome Matin for dyspnea on exertion and HTN  Subjective:    ID: Chad Thornton, male    DOB: March 09, 1956, 63 y.o.   MRN: [id number]  Chief Complaint  Patient presents with  . New Patient (Initial Visit)  . Chest Pain    pt denies    Patient reports last month he had the flu and was having symptoms of chest pain and shortness of breath. Chest discomfort was with activities such as singing at church. He has not had chest discomfort for the last few weeks, but does continue to have shortness of breath and fatigue. He was not having these symptoms 6 months ago. No PND or orthopnea. No leg edema. He does snore at night when lying on his back, but does not wake up feeling fatigued. He does mention that he has had frequent nose bleeds recently.   He has history of hypertension, but has not been uncontrolled until recently. Not currently on therapy. Unsure of his lipids. No history of diabetes or thyroid disorders. 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   Former smoker that quit in 1975. No alcohol use. 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 No other illicit drug use. He is enrolled in drug rehab program that he reports will finish in the next few weeks.   Father died from MI in his late 70's-early 78's.    Past Medical History:  Diagnosis Date  . Acid reflux   . Bipolar 1 disorder (HCC)   . Depression   . Hypertension     Past Surgical History:  Procedure Laterality Date  . BACK SURGERY     2000    Social History   Socioeconomic History  . Marital status: Single    Spouse name: Not on file  . Number of children: 3  . Years of education: Not on file  . Highest education level: Not on file  Occupational History  . Not on file  Social Needs  . Financial resource strain: Not on file  . Food insecurity:    Worry: Not on file    Inability: Not on file  . Transportation needs:   Medical: Not on file    Non-medical: Not on file  Tobacco Use  . Smoking status: Former Smoker    Packs/day: 1.00    Years: 3.00    Pack years: 3.00    Types: Cigarettes    Last attempt to quit: 1975    Years since quitting: 45.2  . Smokeless tobacco: Never Used  Substance and Sexual Activity  . Alcohol use: No  . 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 Sexual activity: Not on file  Lifestyle  . Physical activity:    Days per week: Not on file    Minutes per session: Not on file  . Stress: Not on file  Relationships  . Social connections:    Talks on phone: Not on file    Gets together: Not on file    Attends religious service: Not on file    Active member of club or organization: Not on file    Attends meetings of clubs or organizations: Not on file    Relationship status: Not on file  . Intimate partner violence:    Fear of current or ex partner: Not on file    Emotionally abused:  Not on file    Physically abused: Not on file    Forced sexual activity: Not on file  Other Topics Concern  . Not on file  Social History Narrative  . Not on file    Current Outpatient Medications on File Prior to Visit  Medication Sig Dispense Refill  . albuterol (PROVENTIL HFA;VENTOLIN HFA) 108 (90 Base) MCG/ACT inhaler Inhale 1-2 puffs into the lungs every 4 (four) hours as needed for wheezing or shortness of breath. 1 Inhaler 0  . [employer] GENTLE LAXATIVE 5 MG EC tablet continuous as needed.    . ferrous sulfate 325 (65 FE) MG EC tablet Take 325 mg by mouth daily.    . mirtazapine (REMERON) 30 MG tablet Take 30 mg by mouth at bedtime.     [name] omeprazole (PRILOSEC) 20 MG capsule Take by mouth daily.    . prazosin (MINIPRESS) 5 MG capsule 1 capsule at bedtime.    [name] QUEtiapine (SEROQUEL) 400 MG tablet Take 800 mg by mouth at bedtime.     . sertraline (ZOLOFT) 100 MG tablet Take 1.5 tablets by mouth at bedtime.    . sodium chloride (OCEAN) 0.65 % SOLN nasal spray Place 1  spray into both nostrils as needed (nasal passage irritation). 30 mL 0   No current facility-administered medications on file prior to visit.     Review of Systems  Constitutional: Positive for fatigue. Negative for unexpected weight change.  HENT: Positive for nosebleeds. Negative for congestion.   Eyes: Positive for visual disturbance.  Respiratory: Positive for shortness of breath.   Cardiovascular: Negative for chest pain, palpitations and leg swelling.  Gastrointestinal: Negative for abdominal pain, nausea and vomiting.  Endocrine: Negative for cold intolerance.  Genitourinary: Negative for dysuria.  Musculoskeletal: Negative for myalgias.  Skin: Negative for rash.  Allergic/Immunologic: Negative for immunocompromised state.  Neurological: Positive for headaches. Negative for dizziness and weakness.  Hematological: Does not bruise/bleed easily.  Psychiatric/Behavioral: The patient is not nervous/anxious.   All other systems reviewed and are negative.      Objective:   Physical Exam Vitals signs reviewed.  Constitutional:      Appearance: Normal appearance. He is well-developed. He is obese.  HENT:     Head: Normocephalic and atraumatic.  Neck:     Musculoskeletal: Normal range of motion.     Thyroid: No thyromegaly.     Vascular: Carotid bruit present.     Trachea: Trachea normal.  Cardiovascular:     Rate and Rhythm: Normal rate and regular rhythm.     Pulses:          Carotid pulses are on the right side with bruit and on the left side with bruit.      Femoral pulses are 2+ on the right side and 2+ on the left side.      Popliteal pulses are 2+ on the right side and 2+ on the left side.       Dorsalis pedis pulses are 2+ on the right side and 2+ on the left side.       Posterior tibial pulses are 2+ on the right side and 2+ on the left side.     Heart sounds: Normal heart sounds.  Pulmonary:     Effort: Pulmonary effort is normal.     Breath sounds: Normal breath  sounds.  Abdominal:     General: Abdomen is flat. Bowel sounds are normal. There is no distension.     Palpations: Abdomen is soft.  Tenderness: There is no abdominal tenderness.  Musculoskeletal:     Right lower leg: No edema.     Left lower leg: No edema.  Skin:    General: Skin is warm and dry.     Capillary Refill: Capillary refill takes more than 3 seconds.  Neurological:     General: No focal deficit present.     Mental Status: He is alert and oriented to person, place, and time.  Psychiatric:        Behavior: Behavior is cooperative.    Cardiac Studies:   Labs 05/11/2018 total cholesterol 131, triglycerides 72, HDL 51, LDL 66.  BUN 19, creatinine 1.07, potassium 4.6, CMP otherwise normal.  Vitamin D 34.  EKG 08/31/2018: Normal sinus rhythm at rate of 78 bpm, left atrial enlargement, left ventricle hypertrophy.  No evidence of ischemia.       Assessment & Recommendations:   Dyspnea on exertion - Plan: EKG 12-Lead, PCV ECHOCARDIOGRAM COMPLETE, PCV CARDIAC STRESS TEST  Abnormal EKG  Essential hypertension - Plan: Basic Metabolic Panel (BMET)  History of drug use  Bilateral carotid bruits - Plan: PCV CAROTID DUPLEX (BILATERAL)  Moderate obesity  Recommendations:   Patient with cocaine use, has been clean for the past 8 months, referred to me for evaluation of shortness of breath, fatigue.  He also had an episode of chest pain when he had flu but he has not had any recurrence in spite of exertional physical activity and also heavy labor.  I do not suspect he has angina pectoris however he is extremely high risk for atherosclerotic disease in view of cocaine use.  He also has carotid artery bruit.  Although his lipids are normal, he is definitely prone for developing atherosclerosis due to endothelial dysfunction.  I'll set him up for a carotid artery duplex to 2 bruit, an echocardiogram to evaluate LV function, started him on benazepril/amlodipine combination 20/10 mg  daily.  I'll also set him up for a routine treadmill exercise stress test.  I'll obtain a BMP in 2 weeks.  I have also discussed with him regarding avoidance of calories, patient admits to having gained significant amount of weight since he has been "clean".  We also discussed regarding low salt diet.  He appears to be very motivated in taking care of his health which are very pleased.  I'd like to see him back after the test and make further recommendation.  Thank you for referring the patient to [place]. Please feel free to contact with any questions.   *I have discussed this case with Dr. Jacinto Halim and he personally examined the patient and participated in formulating the plan.Altamese [place], MSN, APRN, FNP-C 08/31/2018, 3:46 PM [facility] [place] Office: [phone]

## 2018-08-31 ENCOUNTER — Encounter: Payer: Self-pay | Admitting: Cardiology

## 2018-08-31 ENCOUNTER — Ambulatory Visit: Payer: Medicare Other | Admitting: Cardiology

## 2018-08-31 VITALS — BP 172/88 | HR 77 | Ht 71.0 in | Wt 275.0 lb

## 2018-08-31 DIAGNOSIS — I10 Essential (primary) hypertension: Secondary | ICD-10-CM | POA: Diagnosis not present

## 2018-08-31 DIAGNOSIS — R0989 Other specified symptoms and signs involving the circulatory and respiratory systems: Secondary | ICD-10-CM

## 2018-08-31 DIAGNOSIS — E668 Other obesity: Secondary | ICD-10-CM

## 2018-08-31 DIAGNOSIS — R0609 Other forms of dyspnea: Secondary | ICD-10-CM

## 2018-08-31 DIAGNOSIS — Z87898 Personal history of other specified conditions: Secondary | ICD-10-CM

## 2018-08-31 DIAGNOSIS — F1991 Other psychoactive substance use, unspecified, in remission: Secondary | ICD-10-CM

## 2018-08-31 DIAGNOSIS — R9431 Abnormal electrocardiogram [ECG] [EKG]: Secondary | ICD-10-CM

## 2018-08-31 MED ORDER — AMLODIPINE BESY-BENAZEPRIL HCL 10-20 MG PO CAPS: 1.0000 | ORAL_CAPSULE | Freq: Every day | ORAL | 1 refills | Status: DC

## 2018-09-05 ENCOUNTER — Other Ambulatory Visit: Payer: Self-pay | Admitting: Cardiology

## 2018-09-06 LAB — BASIC METABOLIC PANEL
BUN/Creatinine Ratio: 16 (ref 10–24)
BUN: 18 mg/dL (ref 8–27)
CO2: 24 mmol/L (ref 20–29)
Calcium: 9.6 mg/dL (ref 8.6–10.2)
Chloride: 104 mmol/L (ref 96–106)
Creatinine, Ser: 1.1 mg/dL (ref 0.76–1.27)
GFR calc Af Amer: 82 mL/min/{1.73_m2} (ref >59)
GFR calc non Af Amer: 71 mL/min/{1.73_m2} (ref >59)
Glucose: 90 mg/dL (ref 65–99)
Potassium: 4.6 mmol/L (ref 3.5–5.2)
Sodium: 142 mmol/L (ref 134–144)

## 2018-09-07 NOTE — Progress Notes (Signed)
lvm

## 2018-09-14 ENCOUNTER — Ambulatory Visit: Payer: Medicare Other

## 2018-09-14 ENCOUNTER — Other Ambulatory Visit: Payer: Self-pay

## 2018-09-14 DIAGNOSIS — R0609 Other forms of dyspnea: Secondary | ICD-10-CM

## 2018-09-14 DIAGNOSIS — R0989 Other specified symptoms and signs involving the circulatory and respiratory systems: Secondary | ICD-10-CM

## 2018-09-18 ENCOUNTER — Other Ambulatory Visit: Payer: Self-pay

## 2018-09-18 ENCOUNTER — Ambulatory Visit (INDEPENDENT_AMBULATORY_CARE_PROVIDER_SITE_OTHER): Payer: Medicare Other | Admitting: Cardiology

## 2018-09-18 ENCOUNTER — Encounter (INDEPENDENT_AMBULATORY_CARE_PROVIDER_SITE_OTHER): Payer: Self-pay

## 2018-09-18 ENCOUNTER — Ambulatory Visit: Payer: Medicare Other

## 2018-09-18 ENCOUNTER — Encounter: Payer: Self-pay | Admitting: Cardiology

## 2018-09-18 VITALS — BP 136/78 | HR 83 | Ht 71.0 in | Wt 269.0 lb

## 2018-09-18 DIAGNOSIS — R9439 Abnormal result of other cardiovascular function study: Secondary | ICD-10-CM | POA: Diagnosis not present

## 2018-09-18 DIAGNOSIS — R0609 Other forms of dyspnea: Principal | ICD-10-CM

## 2018-09-18 DIAGNOSIS — I10 Essential (primary) hypertension: Secondary | ICD-10-CM | POA: Diagnosis not present

## 2018-09-18 MED ORDER — ASPIRIN EC 81 MG PO TBEC: 81.0000 mg | DELAYED_RELEASE_TABLET | Freq: Every day | ORAL | 3 refills | Status: AC

## 2018-09-18 MED ORDER — ATORVASTATIN CALCIUM 10 MG PO TABS: 10.0000 mg | ORAL_TABLET | Freq: Every day | ORAL | 3 refills | Status: DC

## 2018-09-18 NOTE — Progress Notes (Signed)
Subjective:   @Patient  ID: Chad Thornton, male    DOB: 07-16-1955, 63 y.o.   MRN: [id number]  Chief Complaint  Patient presents with  . Follow-up    abnormal gxt    HPI:   Patient was recently evaluated by [place] for abnormal EKG and hypertension. He has past medical history of hypertension, former tobacco use, and 20-30 year history of cocaine use. He has recently quit cocaine use about 7 months ago. He had recently noticed activity intolerance and shortness of breath. Echocardiogram, carotid duplex, and treadmill stress test were ordered. Patient was seen on acute basis today after undergoing his stress test that was abnormal.   He is tolerating medications well and states that blood pressure has improved. No complaints at this time.   Past Medical History:  Diagnosis Date  . Acid reflux   . Bipolar 1 disorder (HCC)   . Depression   . Hypertension     Past Surgical History:  Procedure Laterality Date  . BACK SURGERY     2000    Social History   Socioeconomic History  . Marital status: Single    Spouse name: Not on file  . Number of children: 3  . Years of education: Not on file  . Highest education level: Not on file  Occupational History  . Not on file  Social Needs  . Financial resource strain: Not on file  . Food insecurity:    Worry: Not on file    Inability: Not on file  . Transportation needs:    Medical: Not on file    Non-medical: Not on file  Tobacco Use  . Smoking status: Former Smoker    Packs/day: 1.00    Years: 3.00    Pack years: 3.00    Types: Cigarettes    Last attempt to quit: 1975    Years since quitting: 45.2  . Smokeless tobacco: Never Used  Substance and Sexual Activity  . Alcohol use: No  . 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 Sexual activity: Not on file  Lifestyle  . Physical activity:    Days per week: Not on file    Minutes per session: Not on file  . Stress: Not on file  Relationships  .  Social connections:    Talks on phone: Not on file    Gets together: Not on file    Attends religious service: Not on file    Active member of club or organization: Not on file    Attends meetings of clubs or organizations: Not on file    Relationship status: Not on file  . Intimate partner violence:    Fear of current or ex partner: Not on file    Emotionally abused: Not on file    Physically abused: Not on file    Forced sexual activity: Not on file  Other Topics Concern  . Not on file  Social History Narrative  . Not on file    Current Outpatient Medications on File Prior to Visit  Medication Sig Dispense Refill  . amLODipine-benazepril (LOTREL) 10-20 MG capsule Take 1 capsule by mouth daily. 30 capsule 1  . [facility] GENTLE LAXATIVE 5 MG EC tablet continuous as needed.    . ferrous sulfate 325 (65 FE) MG EC tablet Take 325 mg by mouth daily.    [name] latanoprost (XALATAN) 0.005 % ophthalmic solution Place 1 drop into both eyes at bedtime.    [name]  mirtazapine (REMERON) 30 MG tablet Take 30 mg by mouth at bedtime.     [name] omeprazole (PRILOSEC) 20 MG capsule Take by mouth daily.    . prazosin (MINIPRESS) 5 MG capsule 1 capsule at bedtime.    [name] QUEtiapine (SEROQUEL) 400 MG tablet Take 800 mg by mouth at bedtime.     . sertraline (ZOLOFT) 100 MG tablet Take 1.5 tablets by mouth at bedtime.    . sodium chloride (OCEAN) 0.65 % SOLN nasal spray Place 1 spray into both nostrils as needed (nasal passage irritation). 30 mL 0  . albuterol (PROVENTIL HFA;VENTOLIN HFA) 108 (90 Base) MCG/ACT inhaler Inhale 1-2 puffs into the lungs every 4 (four) hours as needed for wheezing or shortness of breath. (Patient not taking: Reported on 09/18/2018) 1 Inhaler 0   No current facility-administered medications on file prior to visit.     Review of Systems  Constitutional: Positive for fatigue. Negative for unexpected weight change.  HENT: Positive for nosebleeds. Negative for congestion.   Eyes: Positive for visual  disturbance.  Respiratory: Positive for shortness of breath.   Cardiovascular: Negative for chest pain, palpitations and leg swelling.  Gastrointestinal: Negative for abdominal pain, nausea and vomiting.  Endocrine: Negative for cold intolerance.  Genitourinary: Negative for dysuria.  Musculoskeletal: Negative for myalgias.  Skin: Negative for rash.  Allergic/Immunologic: Negative for immunocompromised state.  Neurological: Positive for headaches. Negative for dizziness and weakness.  Hematological: Does not bruise/bleed easily.  Psychiatric/Behavioral: The patient is not nervous/anxious.   All other systems reviewed and are negative.      Objective:   Physical Exam Vitals signs reviewed.  Constitutional:      Appearance: Normal appearance. He is well-developed. He is obese.  HENT:     Head: Normocephalic and atraumatic.  Neck:     Musculoskeletal: Normal range of motion.     Thyroid: No thyromegaly.     Vascular: Carotid bruit present.     Trachea: Trachea normal.  Cardiovascular:     Rate and Rhythm: Normal rate and regular rhythm.     Pulses:          Carotid pulses are on the right side with bruit and on the left side with bruit.      Femoral pulses are 2+ on the right side and 2+ on the left side.      Popliteal pulses are 2+ on the right side and 2+ on the left side.       Dorsalis pedis pulses are 2+ on the right side and 2+ on the left side.       Posterior tibial pulses are 2+ on the right side and 2+ on the left side.     Heart sounds: Normal heart sounds.  Pulmonary:     Effort: Pulmonary effort is normal.     Breath sounds: Normal breath sounds.  Abdominal:     General: Abdomen is flat. Bowel sounds are normal. There is no distension.     Palpations: Abdomen is soft.     Tenderness: There is no abdominal tenderness.  Musculoskeletal:     Right lower leg: No edema.     Left lower leg: No edema.  Skin:    General: Skin is warm and dry.     Capillary Refill:  Capillary refill takes more than 3 seconds.  Neurological:     General: No focal deficit present.     Mental Status: He is alert and oriented to person, place, and time.  Psychiatric:        Behavior: Behavior is cooperative.    Cardiac Studies:   Labs 05/11/2018 total cholesterol 131, triglycerides 72, HDL 51, LDL 66.  BUN 19, creatinine 1.07, potassium 4.6, CMP otherwise normal.  Vitamin D 34.  EKG 08/31/2018: Normal sinus rhythm at rate of 78 bpm, left atrial enlargement, left ventricle hypertrophy.  No evidence of ischemia.       Assessment & Recommendations:  Abnormal stress ECG with treadmill - Plan: PCV MYOCARDIAL PERFUSION WO LEXISCAN  Essential hypertension  Recommendations:   I have discussed treadmill stress test results that show concerning EKG changes during exercise. I feel that patient is high risk given hypertension, age, race, and history of drug use. Will further evaluate with exercise nuclear stress test for further evaluation. Symptomatically patient is stable without any changes. I will start him on daily 81 mg aspirin and also lipitor  daily.   Blood pressure has significantly improved with current medications, continue the same. Will further discuss his other test results at his next office visit. We will see him back after the nuclear stress test.  Altamese [place], MSN, APRN, FNP-C 09/18/2018, 1:46 PM [facility] [place] Office: [phone]

## 2018-09-22 ENCOUNTER — Encounter: Payer: Self-pay | Admitting: Cardiology

## 2018-09-22 DIAGNOSIS — R9439 Abnormal result of other cardiovascular function study: Secondary | ICD-10-CM | POA: Insufficient documentation

## 2018-09-22 DIAGNOSIS — I10 Essential (primary) hypertension: Secondary | ICD-10-CM | POA: Insufficient documentation

## 2018-09-26 ENCOUNTER — Other Ambulatory Visit: Payer: Self-pay | Admitting: Cardiology

## 2018-09-28 ENCOUNTER — Ambulatory Visit: Payer: Medicare Other | Admitting: Cardiology

## 2018-09-29 ENCOUNTER — Ambulatory Visit (INDEPENDENT_AMBULATORY_CARE_PROVIDER_SITE_OTHER): Payer: Medicare Other | Admitting: Cardiology

## 2018-09-29 ENCOUNTER — Encounter: Payer: Self-pay | Admitting: Cardiology

## 2018-09-29 ENCOUNTER — Other Ambulatory Visit: Payer: Self-pay

## 2018-09-29 VITALS — Ht 71.0 in | Wt 265.0 lb

## 2018-09-29 DIAGNOSIS — I10 Essential (primary) hypertension: Secondary | ICD-10-CM

## 2018-09-29 DIAGNOSIS — R0609 Other forms of dyspnea: Secondary | ICD-10-CM

## 2018-09-29 DIAGNOSIS — R9439 Abnormal result of other cardiovascular function study: Secondary | ICD-10-CM | POA: Diagnosis not present

## 2018-09-29 DIAGNOSIS — E7800 Pure hypercholesterolemia, unspecified: Secondary | ICD-10-CM | POA: Diagnosis not present

## 2018-09-29 NOTE — Progress Notes (Addendum)
Virtual Visit via Video Note: This visit type was conducted due to national recommendations for restrictions regarding the COVID-19 Pandemic (e.g. social distancing).  This format is felt to be most appropriate for this patient at this time.  All issues noted in this document were discussed and addressed.  No physical exam was performed (except for noted visual exam findings with Telehealth visits).  The patient has consented to conduct a Telehealth visit and understands insurance will be billed.   I connected with@, on 10/07/18 at  by a video enabled telemedicine application and verified that I am speaking with the correct person using two identifiers.   I discussed the limitations of evaluation and management by telemedicine and the availability of in person appointments. The patient expressed understanding and agreed to proceed.   I have discussed with patient regarding the safety during COVID Pandemic and steps and precautions to be taken including social distancing, frequent hand wash and use of detergent soap, gels with the patient. I asked the patient to avoid touching mouth, nose, eyes, ears with her hands. I encouraged regular walking around the neighborhood and exercise and regular diet, as long as social distancing can be maintained.   Subjective:  Primary Physician:  Jarome Matin, MD  Patient ID: Chad Thornton, male    DOB: [birth year], 63 y.o.   MRN: [id number]  Chief Complaint  Patient presents with  . Hypertension  . Follow-up    echo, gxt, lab results, 4 week    HPI: Chad Thornton  is a 63 y.o. male  with Patient was last seen by [place] when he established care on 08/31/2018 for worsening dyspnea on exertion and exertional chest pain.  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    He underwent treadmill stress test on 09/18/2018 which revealed markedly reduced exercise  tolerance and ST-T wave abnormalities suggestive of ischemia.  Nuclear stress test was ordered however could not be done due to COVID-19 issue.  He now is being seen for follow-up by virtual visit. We were unable to connect via Video conferencing and hence this is a telephone encounter.  I was able to connect with him on 10/06/2017, he states he has not had any chest pain, but has marked dyspnea even doing routine chores of daily living at home. States BP is normal by recent testing with his PCP. No PND or orthopnea,  No leg edema. No palpitations.  Past Medical History:  Diagnosis Date  . Acid reflux   . Bipolar 1 disorder (HCC)   . Depression   . Hypertension     Past Surgical History:  Procedure Laterality Date  . BACK SURGERY     2000    Social History   Socioeconomic History  . Marital status: Single    Spouse name: Not on file  . Number of children: 3  . Years of education: Not on file  . Highest education level: Not on file  Occupational History  . Not on file  Social Needs  . Financial resource strain: Not on file  . Food insecurity:    Worry: Not on file    Inability: Not on file  . Transportation needs:    Medical: Not on file    Non-medical: Not on file  Tobacco Use  . Smoking status: Former Smoker    Packs/day: 1.00    Years: 3.00    Pack years: 3.00  Types: Cigarettes    Last attempt to quit: 1975    Years since quitting: 45.3  . Smokeless tobacco: Never Used  Substance and Sexual Activity  . Alcohol use: No  . 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 Sexual activity: Not on file  Lifestyle  . Physical activity:    Days per week: Not on file    Minutes per session: Not on file  . Stress: Not on file  Relationships  . Social connections:    Talks on phone: Not on file    Gets together: Not on file    Attends religious service: Not on file    Active member of club or organization: Not on file    Attends meetings of  clubs or organizations: Not on file    Relationship status: Not on file  . Intimate partner violence:    Fear of current or ex partner: Not on file    Emotionally abused: Not on file    Physically abused: Not on file    Forced sexual activity: Not on file  Other Topics Concern  . Not on file  Social History Narrative  . Not on file    Current Outpatient Medications on File Prior to Visit  Medication Sig Dispense Refill  . amLODipine-benazepril (LOTREL) 10-20 MG capsule TAKE 1 CAPSULE BY MOUTH EVERY DAY 30 capsule 1  . aspirin EC 81 MG tablet Take 1 tablet (81 mg total) by mouth daily. 90 tablet 3  . atorvastatin (LIPITOR) 10 MG tablet Take 1 tablet (10 mg total) by mouth at bedtime for 30 days. 30 tablet 3  . [employer] GENTLE LAXATIVE 5 MG EC tablet continuous as needed.    . ferrous sulfate 325 (65 FE) MG EC tablet Take 325 mg by mouth daily.    [name] latanoprost (XALATAN) 0.005 % ophthalmic solution Place 1 drop into both eyes at bedtime.    . mirtazapine (REMERON) 30 MG tablet Take 30 mg by mouth at bedtime.     . Multiple Vitamin (MULTIVITAMIN) tablet Take 1 tablet by mouth daily.    [name] omeprazole (PRILOSEC) 20 MG capsule Take by mouth daily.    . prazosin (MINIPRESS) 5 MG capsule 1 capsule at bedtime.    [name] QUEtiapine (SEROQUEL) 400 MG tablet Take 400 mg by mouth at bedtime.     . sertraline (ZOLOFT) 100 MG tablet Take 1.5 tablets by mouth at bedtime.    . sodium chloride (OCEAN) 0.65 % SOLN nasal spray Place 1 spray into both nostrils as needed (nasal passage irritation). 30 mL 0  . albuterol (PROVENTIL HFA;VENTOLIN HFA) 108 (90 Base) MCG/ACT inhaler Inhale 1-2 puffs into the lungs every 4 (four) hours as needed for wheezing or shortness of breath. (Patient not taking: Reported on 09/18/2018) 1 Inhaler 0   No current facility-administered medications on file prior to visit.     Review of Systems  Constitution: Negative for chills, decreased appetite, malaise/fatigue and weight gain.   Cardiovascular: Positive for dyspnea on exertion. Negative for leg swelling and syncope.  Respiratory: Negative for sputum production and wheezing.   Endocrine: Negative for cold intolerance.  Hematologic/Lymphatic: Does not bruise/bleed easily.  Musculoskeletal: Negative for joint swelling.  Gastrointestinal: Negative for abdominal pain, anorexia and change in bowel habit.  Neurological: Negative for headaches and light-headedness.  Psychiatric/Behavioral: Negative for depression and substance abuse.  All other systems reviewed and are negative.     Objective:  Height  5\' 11"  (1.803 m), weight 265 lb (120.2 kg). Body mass index is 36.96 kg/m.  Physical exam not done due to [facility] meeting  Radiology: No results found.  Laboratory Examination:  CMP Latest Ref Rng & Units 09/05/2018 09/10/2016  Glucose 65 - 99 mg/dL 90 161(W)  BUN 8 - 27 mg/dL 18 15  Creatinine 9.60 - 1.27 mg/dL 4.54 0.98  Sodium 119 - 144 mmol/L 142 136  Potassium 3.5 - 5.2 mmol/L 4.6 4.3  Chloride 96 - 106 mmol/L 104 102  CO2 20 - 29 mmol/L 24 28  Calcium 8.6 - 10.2 mg/dL 9.6 9.0   CBC Latest Ref Rng & Units 09/10/2016  WBC 4.0 - 10.5 K/uL 8.8  Hemoglobin 13.0 - 17.0 g/dL 11.7(L)  Hematocrit 39.0 - 52.0 % 36.3(L)  Platelets 150 - 400 K/uL 360   Lipid Panel  No results found for: CHOL, TRIG, HDL, CHOLHDL, VLDL, LDLCALC, LDLDIRECT HEMOGLOBIN A1C No results found for: HGBA1C, MPG TSH No results for input(s): TSH in the last 8760 hours.  CARDIAC STUDIES:   Exercise Treadmill Stress Test 09/18/2018:  Indication:  Chest pain The patient exercised on Bruce protocol for  04:16 min. Patient achieved  6.14 METS and reached HR  135 bpm, which is  86 % of maximum age-predicted HR.  Stress test terminated due to fatigue.    Exercise capacity was below average for age. HR Response to Exercise: Appropriate. BP Response to Exercise: Normal resting BP- appropriate response. Chest Pain: none. Patient had limiting  shortness of breath Arrhythmias: none. Resting EKG demonstrates Normal sinus rhythm. ST Changes: With peak exercise, there were T wave inversions in leads II, III, aVF, V6, that persisted longer than 2 min into recovery.    Overall Impression: Abnorml exercise treadmill stress tst with low exercise capacity.  Recommendation: Consider further cardiac workup/manageent, as clinically indicated.   Echocardiogram 09/14/2018: Left ventricle cavity is normal in size. Moderate concentric hypertrophy of the left ventricle. Normal global wall motion. Doppler evidence of grade II (pseudonormal) diastolic dysfunction, elevated LAP. Calculated EF 60%. Moderate (Grade III) aortic regurgitation. Inadequate TR jet to estimate pulmonary artery systolic pressure. Normal right atrial pressure.   Chest x-ray PA and lateral views 07/07/2018: Normal heart size, mediastinal contours, and pulmonary vascularity. Atherosclerotic calcification aorta. Lungs clear. Assessment:    Abnormal stress ECG with treadmill  Essential hypertension  Dyspnea on exertion  EKG 08/31/2018: Normal sinus rhythm at rate of 78 bpm, left atrial enlargement, LVH. No evidence of ischemia.  Recommendation:  I connected with the patient over the telephone, then attempted to reconnect from disconnection led to inability to connect him.  I have tried several times to make telephone calls to discuss his symptoms and to set up for office visit.  I have not been able to connect him.  I also called her workplace as of 10/05/2018.  He was not there.  We will continue to try to attempt calling him, cell phone goes to voicemail.   I was able to connect with him on 10/07/2018, patient states that his weight and worsening dyspnea on exertion even with minimal activities around the house.  I'm very concerned about high risk stress EKG and ongoing symptoms of dyspnea, we'll wait for nuclear stress test to be performed to make final decision and also  I'll go ahead and obtain echocardiogram.    He is tolerating all his medications well. I will obtain the labs done by his PCP and patient states his labs were normal  and he is taking Atorvastatin daily.  Patient has been maintaining social distancing, has not left the house, has not had any fever or chills or cough.  He will be low risk, advised him to continue to maintain social distancing.  Yates DecampJay Maricsa Sammons, MD, University Of [facility] Davis Medical CenterFACC 10/05/2018, 1:17 PM [facility] [place] Pager: [phone] Office: [phone] If no answer Cell [phone]

## 2018-10-07 NOTE — Addendum Note (Signed)
Addended by: Delrae Rend on: 10/07/2018 03:42 PM   Modules accepted: Level of Service

## 2018-10-09 ENCOUNTER — Other Ambulatory Visit: Payer: Self-pay

## 2018-10-09 ENCOUNTER — Ambulatory Visit (INDEPENDENT_AMBULATORY_CARE_PROVIDER_SITE_OTHER): Payer: Medicare Other

## 2018-10-09 DIAGNOSIS — R9439 Abnormal result of other cardiovascular function study: Secondary | ICD-10-CM | POA: Diagnosis not present

## 2018-10-10 ENCOUNTER — Other Ambulatory Visit: Payer: Self-pay | Admitting: Cardiology

## 2018-10-10 NOTE — Progress Notes (Signed)
I think part of his issue is deconditioning and probably COPD. At least he is low risk and we need referral to pulmonary medicine in 4 weeks for a visit

## 2018-10-13 NOTE — Progress Notes (Signed)
Stress test results were discussed with patient. Will need referral to pulmonary for evaluation. Will arrange for this.

## 2018-10-23 ENCOUNTER — Other Ambulatory Visit: Payer: Self-pay | Admitting: Cardiology

## 2018-10-23 DIAGNOSIS — R0609 Other forms of dyspnea: Principal | ICD-10-CM

## 2018-10-23 MED ORDER — AMLODIPINE BESY-BENAZEPRIL HCL 10-20 MG PO CAPS: ORAL_CAPSULE | ORAL | 1 refills | Status: DC

## 2018-10-25 ENCOUNTER — Other Ambulatory Visit: Payer: Medicare Other

## 2018-11-01 ENCOUNTER — Ambulatory Visit: Payer: Medicare Other | Admitting: Cardiology

## 2018-11-29 ENCOUNTER — Ambulatory Visit: Payer: Medicare Other | Admitting: Cardiology

## 2018-12-13 ENCOUNTER — Other Ambulatory Visit: Payer: Self-pay | Admitting: Cardiology

## 2018-12-14 ENCOUNTER — Encounter: Payer: Self-pay | Admitting: Internal Medicine

## 2018-12-14 ENCOUNTER — Ambulatory Visit: Payer: Medicare Other | Admitting: Internal Medicine

## 2018-12-14 ENCOUNTER — Ambulatory Visit (INDEPENDENT_AMBULATORY_CARE_PROVIDER_SITE_OTHER): Payer: Medicare Other

## 2018-12-14 ENCOUNTER — Other Ambulatory Visit: Payer: Self-pay

## 2018-12-14 DIAGNOSIS — R0609 Other forms of dyspnea: Secondary | ICD-10-CM

## 2018-12-14 DIAGNOSIS — I10 Essential (primary) hypertension: Secondary | ICD-10-CM

## 2018-12-14 MED ORDER — AMLODIPINE-OLMESARTAN 10-20 MG PO TABS: 1.0000 | ORAL_TABLET | Freq: Every day | ORAL | 11 refills | Status: DC

## 2018-12-14 NOTE — Assessment & Plan Note (Signed)
Onset Jan 2020 p viral uri with persistent cough on acei and neg w/u by Nadyne Coombes - trial off acei 12/14/2018 and re-eval in 6 weeks if not better   Symptoms are markedly disproportionate to objective findings and not clear to what extent this is actually a pulmonary  problem but pt does appear to have difficult to sort out respiratory symptoms of unknown origin for which  DDX  = almost all start with A and  include Adherence, Ace Inhibitors, Acid Reflux, Active Sinus Disease, Alpha 1 Antitripsin deficiency, Anxiety masquerading as Airways dz,  ABPA,  Allergy(esp in young), Aspiration (esp in elderly), Adverse effects of meds,  Active smoking or Vaping, A bunch of PE's/clot burden (a few small clots can't cause this syndrome unless there is already severe underlying pulm or vascular dz with poor reserve),  Anemia or thyroid disorder, plus two Bs  = Bronchiectasis and Beta blocker use..and one C= CHF     Adherence is always the initial "prime suspect" and is a multilayered concern that requires a "trust but verify" approach in every patient - starting with knowing how to use medications, especially inhalers, correctly, keeping up with refills and understanding the fundamental difference between maintenance and prns vs those medications only taken for a very short course and then stopped and not refilled.  - if not better return in 6 weeks with all meds in hand using a trust but verify approach to confirm accurate Medication  Reconciliation The principal here is that until we are certain that the  patients are doing what we've asked, it makes no sense to ask them to do more.  ACEi adverse effects at the  top of the usual list of suspects and the only way to rule it out is a trial off > see a/p    ? Acid (or non-acid) GERD > always difficult to exclude as up to 75% of pts in some series report no assoc GI/ Heartburn symptoms> reccontinue acid suppression and diet restrictions/ reviewed    ? Anxiety/ depression/  deconditioning  > usually at the bottom of this list of usual suspects but should be included on this pt's based on H and P and note already on psychotropics and may interfere with adherence and also interpretation of response or lack thereof to symptom management which can be quite subjective.   ? Chf/ihd >  Ruled out by [employer]

## 2018-12-14 NOTE — Assessment & Plan Note (Signed)
In the best review of chronic cough to date ( NEJM 2016 375 [phone]) ,  ACEi are now felt to cause cough in up to  20% of pts which is a 4 fold increase from previous reports and does not include the variety of non-specific complaints we see in pulmonary clinic in pts on ACEi but previously attributed to another dx like  Copd/asthma and  include PNDS, throat tickle and chest congestion, "bronchitis", unexplained dyspnea and noct "strangling" sensations, and hoarseness, but also  atypical /refractory GERD symptoms like dysphagia and "bad heartburn"   The only way I know  to prove this is not an "ACEi Case" is a trial off ACEi x a minimum of 6 weeks then regroup.   Try amlodipine 10-olmesartan 20 mg daily x 6 weeks and return here if not better, to PCP if better with next steps ?  more aggressive gerd RX     Total time devoted to counseling  > 50 % of initial 60 min office visit:  review case with pt/ discussion of options/alternatives/ personally creating written customized instructions  in presence of pt  then going over those specific  Instructions directly with the pt including how to use all of the meds but in particular covering each new medication in detail and the difference between the maintenance= "automatic" meds and the prns using an action plan format for the latter (If this problem/symptom => do that organization reading Left to right).  Please see AVS from this visit for a full list of these instructions which I personally wrote for this pt and  are unique to this visit.

## 2018-12-14 NOTE — Progress Notes (Signed)
Chad Thornton, male    DOB: [birth year],   MRN: [id number]   Brief patient profile:  62 yobm minimal smoking hx on disability due to back pain with hbp on acei  and acute onset sob 1st week in Jan 2020 in setting of uri/flu like with persistent cough/ chest tightness and sob with neg w/u by Chad Thornton so referred to pulmonary clinic 12/14/2018 by Dr  Chad Thornton      History of Present Illness  12/14/2018  Pulmonary/ 1st office eval/Chad Thornton  Chief Complaint  Patient presents with  . Pulmonary Consult    Referred by Dr. Eloise Thornton. Pt c/o DOE x 3-4 months. He gets winded walking approx 1/4 mile.   Dyspnea:  Fast pace x 100 y then has to stop but steps ok / assoc with chest tightrness  Cough: dry cough day > noct "throat tickle" Sleep: able to lie flat/ one pillow  SABA use: made him worse   No obvious day to day or daytime variability or assoc excess/ purulent sputum or mucus plugs or hemoptysis or subjective wheeze or overt sinus or hb symptoms.   Sleeping  without nocturnal  or early am exacerbation  of respiratory  c/o's or need for noct saba. Also denies any obvious fluctuation of symptoms with weather or environmental changes or other aggravating or alleviating factors except as outlined above   No unusual exposure hx or h/o childhood pna/ asthma or knowledge of premature birth.  Current Allergies, Complete Past Medical History, Past Surgical History, Family History, and Social History were reviewed in Chad Thornton Link electronic medical record.  ROS  The following are not active complaints unless bolded Hoarseness, sore throat, dysphagia, dental problems, itching, sneezing,  nasal congestion or discharge of excess mucus or purulent secretions, ear ache,   fever, chills, sweats, unintended wt loss or wt gain, classically pleuritic or exertional cp,  orthopnea pnd or arm/hand swelling  or leg swelling, presyncope, palpitations, abdominal pain, anorexia, nausea, vomiting, diarrhea  or change in  bowel habits or change in bladder habits, change in stools or change in urine, dysuria, hematuria,  rash, arthralgias, visual complaints, headache, numbness, weakness or ataxia or problems with walking or coordination,  change in mood or  memory.           Past Medical History:  Diagnosis Date  . Acid reflux   . Bipolar 1 disorder (HCC)   . Depression   . Hypertension     Outpatient Medications Prior to Visit  Medication Sig Dispense Refill  . albuterol (PROVENTIL HFA;VENTOLIN HFA) 108 (90 Base) MCG/ACT inhaler Inhale 1-2 puffs into the lungs every 4 (four) hours as needed for wheezing or shortness of breath. 1 Inhaler 0  . amLODipine-benazepril (LOTREL) 10-20 MG capsule TAKE 1 CAPSULE BY MOUTH EVERY DAY 90 capsule 1  . aspirin EC 81 MG tablet Take 1 tablet (81 mg total) by mouth daily. 90 tablet 3  . atorvastatin (LIPITOR) 10 MG tablet TAKE 1 TABLET (10 MG TOTAL) BY MOUTH AT BEDTIME FOR 30 DAYS. 90 tablet 1  . [employer]S GENTLE LAXATIVE 5 MG EC tablet continuous as needed.    . latanoprost (XALATAN) 0.005 % ophthalmic solution Place 1 drop into both eyes at bedtime.    . mirtazapine (REMERON SOL-TAB) 15 MG disintegrating tablet Take 15 mg by mouth at bedtime.    . Multiple Vitamin (MULTIVITAMIN) tablet Take 1 tablet by mouth daily.    . prazosin (MINIPRESS) 5 MG capsule 2 mg at bedtime.    .[name]  QUEtiapine (SEROQUEL) 400 MG tablet Take 400 mg by mouth at bedtime.     . sertraline (ZOLOFT) 100 MG tablet Take 1.5 tablets by mouth at bedtime.    . sodium chloride (OCEAN) 0.65 % SOLN nasal spray Place 1 spray into both nostrils as needed (nasal passage irritation). 30 mL 0  . mirtazapine (REMERON) 30 MG tablet Take 30 mg by mouth at bedtime.     [name]. omeprazole (PRILOSEC) 20 MG capsule Take by mouth daily.        Objective:     BP 124/74 (BP Location: Left Arm, Cuff Size: Normal)   Pulse 81   Temp 98.4 F (36.9 C) (Oral)   Ht 6\' 1"  (1.854 m)   Wt 274 lb (124.3 kg)   SpO2 95%   BMI 36.15 kg/m    SpO2: 95 % RA   HEENT: nl dentition, turbinates bilaterally, and oropharynx. Nl external ear canals without cough reflex   NECK :  without JVD/Nodes/TM/ nl carotid upstrokes bilaterally   LUNGS: no acc muscle use,  Nl contour chest which is clear to A and P bilaterally without cough on insp or exp maneuvers   CV:  RRR  no s3 or murmur or increase in P2, and no edema   ABD:  soft and nontender with nl inspiratory excursion in the supine position. No bruits or organomegaly appreciated, bowel sounds nl  MS:  Nl gait/ ext warm without deformities, calf tenderness, cyanosis or clubbing No obvious joint restrictions   SKIN: warm and dry without lesions    NEURO:  alert, approp, nl sensorium with  no motor or cerebellar deficits apparent.      CXR PA and Lateral:   12/14/2018 :    I personally reviewed images and   impression as follows:   Relatively small lung volumes, minimal blunting R CP angle s effusion on lateral          Assessment   Dyspnea on exertion Onset Jan 2020 p viral uri with persistent cough on acei and neg w/u by Chad Thornton - trial off acei 12/14/2018 and re-eval in 6 weeks if not better   Symptoms are markedly disproportionate to objective findings and not clear to what extent this is actually a pulmonary  problem but pt does appear to have difficult to sort out respiratory symptoms of unknown origin for which  DDX  = almost all start with A and  include Adherence, Ace Inhibitors, Acid Reflux, Active Sinus Disease, Alpha 1 Antitripsin deficiency, Anxiety masquerading as Airways dz,  ABPA,  Allergy(esp in young), Aspiration (esp in elderly), Adverse effects of meds,  Active smoking or Vaping, A bunch of PE's/clot burden (a few small clots can't cause this syndrome unless there is already severe underlying pulm or vascular dz with poor reserve),  Anemia or thyroid disorder, plus two Bs  = Bronchiectasis and Beta blocker use..and one C= CHF     Adherence is always the  initial "prime suspect" and is a multilayered concern that requires a "trust but verify" approach in every patient - starting with knowing how to use medications, especially inhalers, correctly, keeping up with refills and understanding the fundamental difference between maintenance and prns vs those medications only taken for a very short course and then stopped and not refilled.  - if not better return in 6 weeks with all meds in hand using a trust but verify approach to confirm accurate Medication  Reconciliation The principal here is that until we are certain  that the  patients are doing what we've asked, it makes no sense to ask them to do more.  ACEi adverse effects at the  top of the usual list of suspects and the only way to rule it out is a trial off > see a/p    ? Acid (or non-acid) GERD > always difficult to exclude as up to 75% of pts in some series report no assoc GI/ Heartburn symptoms> reccontinue acid suppression and diet restrictions/ reviewed    ? Anxiety/ depression/ deconditioning  > usually at the bottom of this list of usual suspects but should be included on this pt's based on H and P and note already on psychotropics and may interfere with adherence and also interpretation of response or lack thereof to symptom management which can be quite subjective.   ? Chf/ihd >  Ruled out by [place]      Essential hypertension In the best review of chronic cough to date ( NEJM 2016 375 [phone]) ,  ACEi are now felt to cause cough in up to  20% of pts which is a 4 fold increase from previous reports and does not include the variety of non-specific complaints we see in pulmonary clinic in pts on ACEi but previously attributed to another dx like  Copd/asthma and  include PNDS, throat tickle and chest congestion, "bronchitis", unexplained dyspnea and noct "strangling" sensations, and hoarseness, but also  atypical /refractory GERD symptoms like dysphagia and "bad heartburn"   The only way I  know  to prove this is not an "ACEi Case" is a trial off ACEi x a minimum of 6 weeks then regroup.   Try amlodipine 10-olmesartan 20 mg daily x 6 weeks and return here if not better, to PCP if better with next steps ?  more aggressive gerd RX       Total time devoted to counseling  > 50 % of initial 60 min office visit:  review case with pt/ discussion of options/alternatives/ personally creating written customized instructions  in presence of pt  then going over those specific  Instructions directly with the pt including how to use all of the meds but in particular covering each new medication in detail and the difference between the maintenance= "automatic" meds and the prns using an action plan format for the latter (If this problem/symptom => do that organization reading Left to right).  Please see AVS from this visit for a full list of these instructions which I personally wrote for this pt and  are unique to this visit.        Christinia Gully, MD 12/14/2018

## 2018-12-14 NOTE — Patient Instructions (Addendum)
Stop amlodipine- benazpril and start amlodipine 10 - olmesartan 20 mg one daily and your symptoms should gradually improve  over a couple of weeks and gone after a month.  Please remember to go to the  x-ray department  for your tests - we will call you with the results when they are available      If you are satisfied with your treatment plan,  let your doctor know and he/she can either refill your medications or you can return here when your prescription runs out.     If in any way you are not 100% satisfied,  please tell us.  If 100% better, tell your friends!  Pulmonary follow up is as needed

## 2018-12-18 NOTE — Progress Notes (Signed)
Spoke with pt and notified of results per Dr. Wert. Pt verbalized understanding and denied any questions. 

## 2018-12-22 ENCOUNTER — Other Ambulatory Visit: Payer: Self-pay

## 2018-12-22 ENCOUNTER — Encounter: Payer: Self-pay | Admitting: Cardiology

## 2018-12-22 ENCOUNTER — Ambulatory Visit (INDEPENDENT_AMBULATORY_CARE_PROVIDER_SITE_OTHER): Payer: Medicare Other | Admitting: Cardiology

## 2018-12-22 VITALS — Ht 73.0 in | Wt 275.0 lb

## 2018-12-22 DIAGNOSIS — I10 Essential (primary) hypertension: Secondary | ICD-10-CM | POA: Diagnosis not present

## 2018-12-22 DIAGNOSIS — R0609 Other forms of dyspnea: Secondary | ICD-10-CM

## 2018-12-22 DIAGNOSIS — I351 Nonrheumatic aortic (valve) insufficiency: Secondary | ICD-10-CM

## 2018-12-22 DIAGNOSIS — E7800 Pure hypercholesterolemia, unspecified: Secondary | ICD-10-CM | POA: Diagnosis not present

## 2018-12-22 DIAGNOSIS — Z87898 Personal history of other specified conditions: Secondary | ICD-10-CM

## 2018-12-22 DIAGNOSIS — F1991 Other psychoactive substance use, unspecified, in remission: Secondary | ICD-10-CM

## 2018-12-22 NOTE — Progress Notes (Signed)
Primary Physician:  Jarome MatinPaterson, Daniel, MD   Patient ID: Chad Thornton, male    DOB: October 20, 1955, 63 y.o.   MRN: [id number]  Subjective:    Chief Complaint  Patient presents with  . Shortness of Breath   This visit type was conducted due to national recommendations for restrictions regarding the COVID-19 Pandemic (e.g. social distancing).  This format is felt to be most appropriate for this patient at this time.  All issues noted in this document were discussed and addressed.  No physical exam was performed (except for noted visual exam findings with Telehealth visits).  The patient has consented to conduct a Telehealth visit and understands insurance will be billed.   I discussed the limitations of evaluation and management by telemedicine and the availability of in person appointments. The patient expressed understanding and agreed to proceed.  Virtual Visit via Video Note is as below  I connected with Chad Thornton, on 12/22/18 at 1305 by telephone and verified that I am speaking with the correct person using two identifiers. Unable to perform video visit as patient did not have equipment.    I have discussed with the patient regarding the safety during COVID Pandemic and steps and precautions including social distancing with the patient.    HPI: Chad Thornton  is a 63 y.o. 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  He underwent treadmill stress test on 09/18/2018 which revealed markedly reduced exercise tolerance and ST-T wave abnormalities suggestive of ischemia. Underwent nuclear stress test on 10/09/18 that was considered low risk study without evidence of perfusion abnormalities. Echocardiogram on 09/14/18 revealed grade 2 diastolic dysfunction, normal LVEF, and moderate aortic regurgitation. Symptoms felt to be related to lung etiology, in which he  underwent evaluation with Dr. Sherene SiresWert on 12/14/18. Due to also having a cough, patient was changed to olmesartan-amlodipine for management as he felt benazepril was contributing to his symptoms.   He has noticed some slight improvement with medication change, but has only been on medication for a few days. He denies any chest pain, has dyspnea on exertion. Weight has been fairly stable. No PND or orthopnea,  No leg edema. No palpitations.   Past Medical History:  Diagnosis Date  . Acid reflux   . Bipolar 1 disorder (HCC)   . Depression   . Hypertension     Past Surgical History:  Procedure Laterality Date  . BACK SURGERY     2000    Social History   Socioeconomic History  . Marital status: Single    Spouse name: Not on file  . Number of children: 3  . Years of education: Not on file  . Highest education level: Not on file  Occupational History  . Not on file  Social Needs  . Financial resource strain: Not on file  . Food insecurity    Worry: Not on file    Inability: Not on file  . Transportation needs    Medical: Not on file    Non-medical: Not on file  Tobacco Use  . Smoking status: Former Smoker    Packs/day: 1.00    Years: 3.00    Pack years: 3.00    Types: Cigarettes    Quit date: 1975    Years since quitting: 45.5  . Smokeless tobacco: Never Used  Substance and Sexual Activity  . Alcohol use: No  . 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 Sexual activity: Not on file  Lifestyle  . Physical activity    Days per week: Not on file    Minutes per session: Not on file  . Stress: Not on file  Relationships  . Social Herbalist on phone: Not on file    Gets together: Not on file    Attends religious service: Not on file    Active member of club or organization: Not on file    Attends meetings of clubs or organizations: Not on file    Relationship status: Not on file  . Intimate partner violence    Fear of current or ex  partner: Not on file    Emotionally abused: Not on file    Physically abused: Not on file    Forced sexual activity: Not on file  Other Topics Concern  . Not on file  Social History Narrative  . Not on file    Review of Systems  Constitution: Negative for decreased appetite, malaise/fatigue, weight gain and weight loss.  Eyes: Negative for visual disturbance.  Cardiovascular: Positive for dyspnea on exertion. Negative for chest pain, claudication, leg swelling, orthopnea, palpitations and syncope.  Respiratory: Negative for hemoptysis and wheezing.   Endocrine: Negative for cold intolerance and heat intolerance.  Hematologic/Lymphatic: Does not bruise/bleed easily.  Skin: Negative for nail changes.  Musculoskeletal: Negative for muscle weakness and myalgias.  Gastrointestinal: Negative for abdominal pain, change in bowel habit, nausea and vomiting.  Neurological: Negative for difficulty with concentration, dizziness, focal weakness and headaches.  Psychiatric/Behavioral: Negative for altered mental status and suicidal ideas.  All other systems reviewed and are negative.     Objective:  Height 6\' 1"  (1.854 m), weight 275 lb (124.7 kg). Body mass index is 36.28 kg/m.    Physical exam not performed or limited due to virtual visit.   Please see exam details from prior visit is as below.   Physical Exam  Constitutional: He is oriented to person, place, and time. Vital signs are normal. He appears well-developed and well-nourished.  HENT:  Head: Normocephalic and atraumatic.  Neck: Normal range of motion.  Cardiovascular: Normal rate, regular rhythm, normal heart sounds and intact distal pulses.  Pulses:      Carotid pulses are on the right side with bruit and on the left side with bruit.      Femoral pulses are 2+ on the right side and 2+ on the left side.      Popliteal pulses are 2+ on the right side and 2+ on the left side.       Dorsalis pedis pulses are 2+ on the right  side and 2+ on the left side.       Posterior tibial pulses are 2+ on the right side and 2+ on the left side.  Pulmonary/Chest: Effort normal and breath sounds normal. No accessory muscle usage. No respiratory distress.  Abdominal: Soft. Bowel sounds are normal.  Musculoskeletal: Normal range of motion.  Neurological: He is alert and oriented to person, place, and time.  Skin: Skin is warm and dry.  Vitals reviewed.  Radiology: No results found.  Laboratory examination:    CMP Latest Ref Rng & Units 09/05/2018 09/10/2016  Glucose 65 - 99 mg/dL 90 112(H)  BUN 8 - 27 mg/dL 18 15  Creatinine 0.76 - 1.27 mg/dL 1.10 1.03  Sodium 134 - 144 mmol/L 142 136  Potassium 3.5 - 5.2 mmol/L 4.6  4.3  Chloride 96 - 106 mmol/L 104 102  CO2 20 - 29 mmol/L 24 28  Calcium 8.6 - 10.2 mg/dL 9.6 9.0   CBC Latest Ref Rng & Units 09/10/2016  WBC 4.0 - 10.5 K/uL 8.8  Hemoglobin 13.0 - 17.0 g/dL 11.7(L)  Hematocrit 39.0 - 52.0 % 36.3(L)  Platelets 150 - 400 K/uL 360   Lipid Panel  No results found for: CHOL, TRIG, HDL, CHOLHDL, VLDL, LDLCALC, LDLDIRECT HEMOGLOBIN A1C No results found for: HGBA1C, MPG TSH No results for input(s): TSH in the last 8760 hours.  PRN Meds:. There are no discontinued medications. Current Meds  Medication Sig  . albuterol (PROVENTIL HFA;VENTOLIN HFA) 108 (90 Base) MCG/ACT inhaler Inhale 1-2 puffs into the lungs every 4 (four) hours as needed for wheezing or shortness of breath.  [name]. amlodipine-olmesartan (AZOR) 10-20 MG tablet Take 1 tablet by mouth daily.  [name]. aspirin EC 81 MG tablet Take 1 tablet (81 mg total) by mouth daily.  [name]. atorvastatin (LIPITOR) 10 MG tablet TAKE 1 TABLET (10 MG TOTAL) BY MOUTH AT BEDTIME FOR 30 DAYS.  [name]. [employer] GENTLE LAXATIVE 5 MG EC tablet continuous as needed.  . latanoprost (XALATAN) 0.005 % ophthalmic solution Place 1 drop into both eyes at bedtime.  . mirtazapine (REMERON SOL-TAB) 15 MG disintegrating tablet Take 15 mg by mouth at bedtime.  .  Multiple Vitamin (MULTIVITAMIN) tablet Take 1 tablet by mouth daily.  . Netarsudil-Latanoprost (ROCKLATAN) 0.02-0.005 % SOLN Apply 1 drop to eye daily. In each at night  . prazosin (MINIPRESS) 5 MG capsule 2 mg at bedtime.  [name]. QUEtiapine (SEROQUEL) 400 MG tablet Take 400 mg by mouth at bedtime.   . sertraline (ZOLOFT) 100 MG tablet Take 1.5 tablets by mouth at bedtime.  . sodium chloride (OCEAN) 0.65 % SOLN nasal spray Place 1 spray into both nostrils as needed (nasal passage irritation).    Cardiac Studies:   Exercise myoview stress 10/09/2018:  1. The patient performed treadmill exercise using Bruce protocol, completing 5:00 minutes. The patient completed an estimated workload of 7 METS, reaching 99% of the maximum predicted heart rate. Exercise capacity was low. Hemodynamic response was normal. No stress symptoms reported. The stress electrocardiogram showed sinus tachycardia, normal stress conduction, no stress arrhythmias and T wave inversion in leads II,III,aVF,V4-V6 lasting two min into recovery. Stress electrocardiogram is equivocal for ischemia 2. The overall quality of the study is good. There is no evidence of abnormal lung activity. Stress and rest SPECT images demonstrate homogeneous tracer distribution throughout the myocardium. Gated SPECT imaging reveals normal myocardial thickening and wall motion. The left ventricular ejection fraction was normal (50%).   3. Low risk study.   Exercise Treadmill Stress Test 09/18/2018:  Indication:  Chest pain The patient exercised on Bruce protocol for  04:16 min. Patient achieved  6.14 METS and reached HR  135 bpm, which is  86 % of maximum age-predicted HR.  Stress test terminated due to fatigue.   Exercise capacity was below average for age. HR Response to Exercise: Appropriate. BP Response to Exercise: Normal resting BP- appropriate response. Chest Pain: none. Patient had limiting shortness of breath Arrhythmias: none. Resting EKG  demonstrates Normal sinus rhythm. ST Changes: With peak exercise, there were T wave inversions in leads II, III, aVF, V6, that persisted longer than 2 min into recovery.   Overall Impression: Abnorml exercise treadmill stress tst with low exercise capacity.  Recommendation: Consider further cardiac workup/managent, as clinically indicated.   Echocardiogram 09/14/2018: Left  ventricle cavity is normal in size. Moderate concentric hypertrophy of the left ventricle. Normal global wall motion. Doppler evidence of grade II (pseudonormal) diastolic dysfunction, elevated LAP. Calculated EF 60%. Moderate (Grade III) aortic regurgitation. Inadequate TR jet to estimate pulmonary artery systolic pressure. Normal right atrial pressure.   Chest x-ray PA and lateral views 07/07/2018: Normal heart size, mediastinal contours, and pulmonary vascularity. Atherosclerotic calcification aorta. Lungs clear.  Assessment:     ICD-10-CM   1. Dyspnea on exertion  R06.09   2. Essential hypertension  I10   3. Hypercholesteremia  E78.00   4. Moderate aortic regurgitation  I35.1   5. History of drug use  Z87.898     EKG 08/31/2018: Normal sinus rhythm at rate of 78 bpm, left atrial enlargement, LVH. No evidence of ischemia.  Recommendations:   Patient is presently doing well, has noticed some improvement in dyspnea on exertion and cough with changes to his blood pressure medicine.  We will continue the same and hopefully will continue to improve.  I suspect deconditioning and also obesity are contributing to his symptoms, encouraged him to at least try for 15 pound weight loss with diet modifications and 20 minutes of daily walking.  He has not had any exertional chest pain, had reassuring Lexiscan nuclear stress test.  Lipids are well controlled.  Blood pressure is also well controlled by his report, but did not have a way to check today.  He does have grade 2 diastolic dysfunction and moderate aortic  regurgitation by echocardiogram in March.  Hopefully with better blood pressure control and weight loss, diastolic dysfunction will improve.  I do not suspect aortic regurgitation is contributing to his symptoms, but would recommend repeating echocardiogram in 1 to 2 years for surveillance.  He is on amlodipine and would recommend continuing the same in view of his aortic regurgitation.  Continues to remain abstinent from drug, alcohol and tobacco use, congratulated him on this and provided positive reinforcement.  I will see him back in 3 months for follow-up on his symptoms.  Toniann FailAshton Haynes Rhyder Koegel, MSN, APRN, FNP-C [facility] Office: [phone] Fax: [phone]

## 2019-03-06 ENCOUNTER — Other Ambulatory Visit: Payer: Self-pay | Admitting: Internal Medicine

## 2019-03-06 ENCOUNTER — Other Ambulatory Visit: Payer: Self-pay

## 2019-03-06 DIAGNOSIS — E7800 Pure hypercholesterolemia, unspecified: Secondary | ICD-10-CM

## 2019-03-06 MED ORDER — AMLODIPINE-OLMESARTAN 10-20 MG PO TABS: 1.0000 | ORAL_TABLET | Freq: Every day | ORAL | 3 refills | Status: AC

## 2019-03-06 MED ORDER — ATORVASTATIN CALCIUM 10 MG PO TABS: 10.0000 mg | ORAL_TABLET | Freq: Every day | ORAL | 1 refills | Status: DC

## 2019-03-07 ENCOUNTER — Other Ambulatory Visit: Payer: Self-pay

## 2019-03-26 ENCOUNTER — Ambulatory Visit: Payer: Medicare Other | Admitting: Cardiology

## 2019-04-20 ENCOUNTER — Encounter: Payer: Self-pay | Admitting: Cardiology

## 2019-04-20 ENCOUNTER — Ambulatory Visit: Payer: Medicare Other | Admitting: Cardiology

## 2019-04-20 ENCOUNTER — Other Ambulatory Visit: Payer: Self-pay

## 2019-04-20 VITALS — BP 141/81 | HR 61 | Temp 96.0°F | Ht 73.0 in | Wt 291.6 lb

## 2019-04-20 DIAGNOSIS — I351 Nonrheumatic aortic (valve) insufficiency: Secondary | ICD-10-CM

## 2019-04-20 DIAGNOSIS — R0600 Dyspnea, unspecified: Secondary | ICD-10-CM

## 2019-04-20 DIAGNOSIS — R0609 Other forms of dyspnea: Secondary | ICD-10-CM

## 2019-04-20 DIAGNOSIS — I8393 Asymptomatic varicose veins of bilateral lower extremities: Secondary | ICD-10-CM | POA: Diagnosis not present

## 2019-04-20 DIAGNOSIS — I10 Essential (primary) hypertension: Secondary | ICD-10-CM | POA: Diagnosis not present

## 2019-04-20 NOTE — Progress Notes (Signed)
Primary Physician:  Jarome MatinPaterson, Daniel, MD   Patient ID: Chad Thornton, male    DOB: 19-Sep-1955, 63 y.o.   MRN: [id number]  Subjective:    Chief Complaint  Patient presents with   Shortness of Breath   Hypertension   Follow-up    HPI: Chad Thornton  is a 63 y.o. 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  He underwent treadmill stress test on 09/18/2018 which revealed markedly reduced exercise tolerance and ST-T wave abnormalities suggestive of ischemia. Underwent nuclear stress test on 10/09/18 that was considered low risk study without evidence of perfusion abnormalities. Echocardiogram on 09/14/18 revealed grade 2 diastolic dysfunction, normal LVEF, and moderate aortic regurgitation. Symptoms felt to be related to lung etiology, in which he underwent evaluation with Dr. Sherene SiresWert on 12/14/18. Due to also having a cough, patient was changed to olmesartan-amlodipine for management as he felt benazepril was contributing to his symptoms.   Patient is here for 3 month follow up. He continues to notice improvement in shortness of breath with medication changes. No chest pain. He does mention some swelling to his left leg after being on his feet for long periods of time. He does admit to weight gain over the last few months.   Past Medical History:  Diagnosis Date   Acid reflux    Bipolar 1 disorder (HCC)    Depression    Hypertension     Past Surgical History:  Procedure Laterality Date   BACK SURGERY     2000    Social History   Socioeconomic History   Marital status: Single    Spouse name: Not on file   Number of children: 3   Years of education: Not on file   Highest education level: Not on file  Occupational History   Not on file  Social Needs   Financial resource strain: Not on file   Food insecurity    Worry: Not on  file    Inability: Not on file   Transportation needs    Medical: Not on file    Non-medical: Not on file  Tobacco Use   Smoking status: Former Smoker    Packs/day: 1.00    Years: 3.00    Pack years: 3.00    Types: Cigarettes    Quit date: 781975    Years since quitting: 45.8   Smokeless tobacco: Never Used  Substance and Sexual Activity   Alcohol use: No   Drug use: No    Types: Cocaine    Comment: recovering 11/2015   Sexual activity: Not on file  Lifestyle   Physical activity    Days per week: Not on file    Minutes per session: Not on file   Stress: Not on file  Relationships   Social connections    Talks on phone: Not on file    Gets together: Not on file    Attends religious service: Not on file    Active member of club or organization: Not on file    Attends meetings of clubs or organizations: Not on file    Relationship status: Not on file   Intimate partner violence    Fear of current or ex partner: Not on file    Emotionally abused: Not on file    Physically abused: Not on file    Forced sexual activity: Not on file  Other Topics Concern  Not on file  Social History Narrative   Not on file    Review of Systems  Constitution: Negative for decreased appetite, malaise/fatigue, weight gain and weight loss.  Eyes: Negative for visual disturbance.  Cardiovascular: Positive for dyspnea on exertion and leg swelling (right leg occasionally). Negative for chest pain, claudication, orthopnea, palpitations and syncope.  Respiratory: Negative for hemoptysis and wheezing.   Endocrine: Negative for cold intolerance and heat intolerance.  Hematologic/Lymphatic: Does not bruise/bleed easily.  Skin: Negative for nail changes.  Musculoskeletal: Negative for muscle weakness and myalgias.  Gastrointestinal: Negative for abdominal pain, change in bowel habit, nausea and vomiting.  Neurological: Negative for difficulty with concentration, dizziness, focal weakness  and headaches.  Psychiatric/Behavioral: Negative for altered mental status and suicidal ideas.  All other systems reviewed and are negative.     Objective:  Blood pressure (!) 141/81, pulse 61, temperature (!) 96 F (35.6 C), height 6\' 1"  (1.854 m), weight 291 lb 9.6 oz (132.3 kg), SpO2 96 %. Body mass index is 38.47 kg/m.   Physical Exam  Constitutional: He is oriented to person, place, and time. Vital signs are normal. He appears well-developed and well-nourished.  HENT:  Head: Normocephalic and atraumatic.  Neck: Normal range of motion.  Cardiovascular: Normal rate, regular rhythm, normal heart sounds and intact distal pulses.  Pulses:      Carotid pulses are on the right side with bruit and on the left side with bruit.      Femoral pulses are 2+ on the right side and 2+ on the left side.      Popliteal pulses are 2+ on the right side and 2+ on the left side.       Dorsalis pedis pulses are 2+ on the right side and 2+ on the left side.       Posterior tibial pulses are 2+ on the right side and 2+ on the left side.  Pulmonary/Chest: Effort normal and breath sounds normal. No accessory muscle usage. No respiratory distress.  Abdominal: Soft. Bowel sounds are normal.  Musculoskeletal: Normal range of motion.  Neurological: He is alert and oriented to person, place, and time.  Skin: Skin is warm and dry.  Vitals reviewed.  Radiology: No results found.  Laboratory examination:    CMP Latest Ref Rng & Units 09/05/2018 09/10/2016  Glucose 65 - 99 mg/dL 90 112(H)  BUN 8 - 27 mg/dL 18 15  Creatinine 0.76 - 1.27 mg/dL 1.10 1.03  Sodium 134 - 144 mmol/L 142 136  Potassium 3.5 - 5.2 mmol/L 4.6 4.3  Chloride 96 - 106 mmol/L 104 102  CO2 20 - 29 mmol/L 24 28  Calcium 8.6 - 10.2 mg/dL 9.6 9.0   CBC Latest Ref Rng & Units 09/10/2016  WBC 4.0 - 10.5 K/uL 8.8  Hemoglobin 13.0 - 17.0 g/dL 11.7(L)  Hematocrit 39.0 - 52.0 % 36.3(L)  Platelets 150 - 400 K/uL 360   Lipid Panel  No  results found for: CHOL, TRIG, HDL, CHOLHDL, VLDL, LDLCALC, LDLDIRECT HEMOGLOBIN A1C No results found for: HGBA1C, MPG TSH No results for input(s): TSH in the last 8760 hours.  PRN Meds:. Medications Discontinued During This Encounter  Medication Reason   prazosin (MINIPRESS) 5 MG capsule Error   Current Meds  Medication Sig   albuterol (PROVENTIL HFA;VENTOLIN HFA) 108 (90 Base) MCG/ACT inhaler Inhale 1-2 puffs into the lungs every 4 (four) hours as needed for wheezing or shortness of breath.   amlodipine-olmesartan (AZOR) 10-20 MG tablet Take 1  tablet by mouth daily.   aspirin EC 81 MG tablet Take 1 tablet (81 mg total) by mouth daily. (Patient taking differently: Take 81 mg by mouth daily as needed. )   atorvastatin (LIPITOR) 10 MG tablet Take 1 tablet (10 mg total) by mouth at bedtime.   [employer] GENTLE LAXATIVE 5 MG EC tablet continuous as needed.   latanoprost (XALATAN) 0.005 % ophthalmic solution Place 1 drop into both eyes at bedtime.   mirtazapine (REMERON SOL-TAB) 15 MG disintegrating tablet Take 15 mg by mouth at bedtime.   Multiple Vitamin (MULTIVITAMIN) tablet Take 1 tablet by mouth daily.   Netarsudil-Latanoprost (ROCKLATAN) 0.02-0.005 % SOLN Apply 1 drop to eye daily. In each at night   prazosin (MINIPRESS) 2 MG capsule Take 2 mg by mouth at bedtime.   QUEtiapine (SEROQUEL) 400 MG tablet Take 400 mg by mouth at bedtime.    sertraline (ZOLOFT) 100 MG tablet Take 1.5 tablets by mouth at bedtime.   sodium chloride (OCEAN) 0.65 % SOLN nasal spray Place 1 spray into both nostrils as needed (nasal passage irritation).    Cardiac Studies:   Exercise myoview stress 10/09/2018:  1. The patient performed treadmill exercise using Bruce protocol, completing 5:00 minutes. The patient completed an estimated workload of 7 METS, reaching 99% of the maximum predicted heart rate. Exercise capacity was low. Hemodynamic response was normal. No stress symptoms reported. The stress  electrocardiogram showed sinus tachycardia, normal stress conduction, no stress arrhythmias and T wave inversion in leads II,III,aVF,V4-V6 lasting two min into recovery. Stress electrocardiogram is equivocal for ischemia 2. The overall quality of the study is good. There is no evidence of abnormal lung activity. Stress and rest SPECT images demonstrate homogeneous tracer distribution throughout the myocardium. Gated SPECT imaging reveals normal myocardial thickening and wall motion. The left ventricular ejection fraction was normal (50%).   3. Low risk study.   Carotid artery duplex  09/14/2018 There is very minimal plaque noted in bilateral carotid arteries. Antegrade right vertebral artery flow. Antegrade left vertebral artery flow.  Exercise Treadmill Stress Test 09/18/2018:  Indication:  Chest pain The patient exercised on Bruce protocol for  04:16 min. Patient achieved  6.14 METS and reached HR  135 bpm, which is  86 % of maximum age-predicted HR.  Stress test terminated due to fatigue.   Exercise capacity was below average for age. HR Response to Exercise: Appropriate. BP Response to Exercise: Normal resting BP- appropriate response. Chest Pain: none. Patient had limiting shortness of breath Arrhythmias: none. Resting EKG demonstrates Normal sinus rhythm. ST Changes: With peak exercise, there were T wave inversions in leads II, III, aVF, V6, that persisted longer than 2 min into recovery.   Overall Impression: Abnorml exercise treadmill stress tst with low exercise capacity.  Recommendation: Consider further cardiac workup/managent, as clinically indicated.   Echocardiogram 09/14/2018: Left ventricle cavity is normal in size. Moderate concentric hypertrophy of the left ventricle. Normal global wall motion. Doppler evidence of grade II (pseudonormal) diastolic dysfunction, elevated LAP. Calculated EF 60%. Moderate (Grade III) aortic regurgitation. Inadequate TR jet to estimate  pulmonary artery systolic pressure. Normal right atrial pressure.   Chest x-ray PA and lateral views 07/07/2018: Normal heart size, mediastinal contours, and pulmonary vascularity. Atherosclerotic calcification aorta. Lungs clear.  Assessment:     ICD-10-CM   1. Dyspnea on exertion  R06.00 EKG 12-Lead  2. Moderate aortic regurgitation  I35.1 PCV ECHOCARDIOGRAM COMPLETE  3. Essential hypertension  I10   4. Varicose veins of both  lower extremities, unspecified whether complicated  I83.93     EKG 04/20/2019: Normal sinus rhythm at 81 bpm, left atrial abnormality.  LVH.  No evidence of ischemia.  Recommendations:   Patient is here for 66-month office visit and follow-up for dyspnea on exertion.  He reports that this is continued to improve.  His blood pressure is fairly well controlled with current medications, will continue the same.  He does admit to 15 pound weight gain since the pandemic, encouraged him to start regular exercise to help with this.  He does have moderate aortic regurgitation by previous echocardiogram but will need continued surveillance.  I will plan repeating his echocardiogram in April 2021.  No clinical evidence of decompensated heart failure.  He has not had any exertional chest pain.  He does have some varicosities noted to his right leg and has mild swelling in his leg on days he is on his feet for long periods of time. Encouraged him to obtain support stockings to help.   I have congratulated him on remaining abstinent from tobacco, alcohol, and drug use.  Encouraged him to continue with this.  Overall he is stable from a cardiac standpoint.  I will see him back after his echocardiogram in 6 months for follow-up.  Toniann Fail, MSN, APRN, FNP-C [facility] Office: [phone] Fax: [phone]

## 2019-04-27 ENCOUNTER — Ambulatory Visit: Payer: Medicare Other | Admitting: Cardiology

## 2019-10-15 ENCOUNTER — Other Ambulatory Visit: Payer: Self-pay | Admitting: Cardiology

## 2019-10-15 DIAGNOSIS — E7800 Pure hypercholesterolemia, unspecified: Secondary | ICD-10-CM

## 2019-10-19 ENCOUNTER — Other Ambulatory Visit: Payer: Medicare Other

## 2019-11-13 ENCOUNTER — Ambulatory Visit: Payer: Medicare Other

## 2019-11-13 ENCOUNTER — Other Ambulatory Visit: Payer: Self-pay

## 2019-11-13 DIAGNOSIS — I351 Nonrheumatic aortic (valve) insufficiency: Secondary | ICD-10-CM

## 2019-11-23 ENCOUNTER — Ambulatory Visit: Payer: Medicare Other | Admitting: Cardiology

## 2019-12-03 NOTE — Progress Notes (Deleted)
Chad Thornton Date of Birth: [birth year] MRN: [id number] Primary Care Provider:Paterson, Reuel Boom, MD Former Cardiology Providers: Altamese [place], APRN, FNP-C Primary Cardiologist: Tessa Lerner, DO, [facility] (established care 12/03/2019)  Date: 12/03/19 Last Visit: Visit date not found   No chief complaint on file.   HPI  Chad Thornton is a 64 y.o.  male who presents to the office with a chief complaint of "***." Patient's past medical history and cardiovascular risk factors include: Moderate aortic regurgitation, hypertension, hyperlipidemia, tobacco use disorder quit in 1975, former cocaine use, quit in July 2020 .  Patient was referred to the office at the request of his primary care provider for evaluation of dyspnea and hypertension back in March 2020.  His former cardiology providers have been Dr. Yates Decamp and Altamese [place], APRN, FNP-C.  He was last seen in the office back in March 2020 by Altamese [place], APRN, FNP-C.  Patient did not transition his care to myself as Altamese [place], APRN, FNP-C is no longer with the practice.  During his evaluation of dyspnea on exertion patient underwent exercise treadmill stress test which was noted to be abnormal.  He subsequently underwent nuclear stress test which was noted to be low risk without evidence of perfusion abnormality.  An echocardiogram reported preserved left ventricular systolic function with grade 2 diastolic impairment and moderate aortic regurgitation.  In the setting of shortness of breath his benazepril was changed to olmesartan/amlodipine.  He presents to the office for follow-up.  Since last office visit patient states that he is doing well from a cardiovascular standpoint.***  Shortness of breath remains stable since last office visit.***  At the last office visit patient had gained at least 15 pounds since the pandemic.  He was encouraged to increase physical activity as tolerated.***  Since last office visit he was also  recommended to have an echocardiogram to reevaluate the severity of aortic regurgitation.  His most recent echocardiogram from May 2021 notes preserved left ventricular systolic function, grade 2 diastolic impairment, and moderate aortic regurgitation.  No significant change compared to prior echo.  Additional details noted below.  His questions concerns were addressed to his satisfaction.  ***  He underwent treadmill stress test on 09/18/2018 which revealed markedly reduced exercise tolerance and ST-T wave abnormalities suggestive of ischemia. Underwent nuclear stress test on 10/09/18 that was considered low risk study without evidence of perfusion abnormalities. Echocardiogram on 09/14/18 revealed grade 2 diastolic dysfunction, normal LVEF, and moderate aortic regurgitation. Symptoms felt to be related to lung etiology, in which he underwent evaluation with Dr. Sherene Sires on 12/14/18. Due to also having a cough, patient was changed to olmesartan-amlodipine for management as he felt benazepril was contributing to his symptoms.   Patient is here for 3 month follow up. He continues to notice improvement in shortness of breath with medication changes. No chest pain. He does mention some swelling to his left leg after being on his feet for long periods of time. He does admit to weight gain over the last few months.  Father died from MI in his late 40's-early 31's.   History of  Denies prior history of coronary artery disease, myocardial infarction, congestive heart failure, deep venous thrombosis, pulmonary embolism, stroke, transient ischemic attack.  FUNCTIONAL STATUS: ***   ALLERGIES: No Known Allergies   MEDICATION LIST PRIOR TO VISIT: Current Outpatient Medications on File Prior to Visit  Medication Sig Dispense Refill  . albuterol (PROVENTIL HFA;VENTOLIN HFA) 108 (90 Base) MCG/ACT inhaler Inhale 1-2 puffs into  the lungs every 4 (four) hours as needed for wheezing or shortness of breath. 1 Inhaler  0  . amlodipine-olmesartan (AZOR) 10-20 MG tablet Take 1 tablet by mouth daily. 90 tablet 3  . aspirin EC 81 MG tablet Take 1 tablet (81 mg total) by mouth daily. (Patient taking differently: Take 81 mg by mouth daily as needed. ) 90 tablet 3  . atorvastatin (LIPITOR) 10 MG tablet TAKE 1 TABLET BY MOUTH AT  BEDTIME 90 tablet 0  . CVS GENTLE LAXATIVE 5 MG EC tablet continuous as needed.    . latanoprost (XALATAN) 0.005 % ophthalmic solution Place 1 drop into both eyes at bedtime.    . mirtazapine (REMERON SOL-TAB) 15 MG disintegrating tablet Take 15 mg by mouth at bedtime.    . Multiple Vitamin (MULTIVITAMIN) tablet Take 1 tablet by mouth daily.    . Netarsudil-Latanoprost (ROCKLATAN) 0.02-0.005 % SOLN Apply 1 drop to eye daily. In each at night    . prazosin (MINIPRESS) 2 MG capsule Take 2 mg by mouth at bedtime.    [name] QUEtiapine (SEROQUEL) 400 MG tablet Take 400 mg by mouth at bedtime.     . sertraline (ZOLOFT) 100 MG tablet Take 1.5 tablets by mouth at bedtime.    . sodium chloride (OCEAN) 0.65 % SOLN nasal spray Place 1 spray into both nostrils as needed (nasal passage irritation). 30 mL 0   No current facility-administered medications on file prior to visit.    PAST MEDICAL HISTORY: Past Medical History:  Diagnosis Date  . Acid reflux   . Bipolar 1 disorder (HCC)   . Depression   . Hypertension     PAST SURGICAL HISTORY: Past Surgical History:  Procedure Laterality Date  . BACK SURGERY     2000    FAMILY HISTORY: The patient's family history includes Heart attack in his father; Hypertension in his mother.   SOCIAL HISTORY:  The patient  reports that he quit smoking about 46 years ago. His smoking use included cigarettes. He has a 3.00 pack-year smoking history. He has never used smokeless tobacco. He reports that he does not drink alcohol or use drugs.  ROS  PHYSICAL EXAM: Vitals with BMI 04/20/2019 12/22/2018 12/14/2018  Height 6\' 1"  6\' 1"  6\' 1"   Weight 291 lbs 10 oz 275  lbs 274 lbs  BMI 38.48 36.29 36.16  Systolic 141 - 124  Diastolic 81 - 74  Pulse 61 - 81    CONSTITUTIONAL: Well-developed and well-nourished. No acute distress.  SKIN: Skin is warm and dry. No rash noted. No cyanosis. No pallor. No jaundice HEAD: Normocephalic and atraumatic.  EYES: No scleral icterus MOUTH/THROAT: Moist oral membranes.  NECK: No JVD present. No thyromegaly noted. No carotid bruits  LYMPHATIC: No visible cervical adenopathy.  CHEST Normal respiratory effort. No intercostal retractions  LUNGS: *** No stridor. No wheezes. No rales.  CARDIOVASCULAR: *** ABDOMINAL: No apparent ascites.  EXTREMITIES: No peripheral edema  HEMATOLOGIC: No significant bruising NEUROLOGIC: Oriented to person, place, and time. Nonfocal. Normal muscle tone.  PSYCHIATRIC: Normal mood and affect. Normal behavior. Cooperative  RADIOLOGY: Chest x-ray PA and lateral views 07/07/2018: Normal heart size, mediastinal contours, and pulmonary vascularity. Atherosclerotic calcification aorta. Lungs clear.  CARDIAC DATABASE: EKG: 04/20/2019: Normal sinus rhythm at 81 bpm, left atrial abnormality.  LVH.  No evidence of ischemia.  Echocardiogram: 09/14/2018: LVEF 60%, grade 2 diastolic impairment, moderate LVH, elevated left atrial pressure, moderate (grade 3) aortic regurgitation. 11/13/2019: LVEF 55%, grade 2 diastolic impairment, moderate  LVH, elevated left atrial pressure, moderate (grade 3) AR, mild TR, no evidence of pulmonary hypertension per RVSP.***  Stress Testing:  Exercise myoview stress 10/09/2018:  1. The patient performed treadmill exercise using Bruce protocol, completing 5:00 minutes. The patient completed an estimated workload of 7 METS, reaching 99% of the maximum predicted heart rate. Exercise capacity was low. Hemodynamic response was normal. No stress symptoms reported. The stress electrocardiogram showed sinus tachycardia, normal stress conduction, no stress arrhythmias and T wave  inversion in leads II,III,aVF,V4-V6 lasting two min into recovery. Stress electrocardiogram is equivocal for ischemia 2. Stress and rest SPECT images demonstrate homogeneous tracer distribution throughout the myocardium. Gated SPECT imaging reveals normal myocardial thickening and wall motion. The left ventricular ejection fraction was normal (50%).  3. Low risk study.   Heart Catheterization: None ***  Carotid duplex: 09/14/2018: There is very minimal plaque noted in bilateral carotid arteries. Antegrade right vertebral artery flow. Antegrade left vertebral artery flow.  LABORATORY DATA: CBC Latest Ref Rng & Units 09/10/2016  WBC 4.0 - 10.5 K/uL 8.8  Hemoglobin 13.0 - 17.0 g/dL 11.7(L)  Hematocrit 39.0 - 52.0 % 36.3(L)  Platelets 150 - 400 K/uL 360    CMP Latest Ref Rng & Units 09/05/2018 09/10/2016  Glucose 65 - 99 mg/dL 90 112(H)  BUN 8 - 27 mg/dL 18 15  Creatinine 0.76 - 1.27 mg/dL 1.10 1.03  Sodium 134 - 144 mmol/L 142 136  Potassium 3.5 - 5.2 mmol/L 4.6 4.3  Chloride 96 - 106 mmol/L 104 102  CO2 20 - 29 mmol/L 24 28  Calcium 8.6 - 10.2 mg/dL 9.6 9.0    Lipid Panel  No results found for: CHOL, TRIG, HDL, CHOLHDL, VLDL, LDLCALC, LDLDIRECT, LABVLDL  No results found for: HGBA1C No components found for: NTPROBNP No results found for: TSH  Cardiac Panel (last 3 results) No results for input(s): CKTOTAL, CKMB, TROPONINIHS, RELINDX in the last 72 hours.  IMPRESSION:    ICD-10-CM   1. Dyspnea on exertion  R06.00   2. Moderate aortic regurgitation  I35.1   3. Essential hypertension  I10   4. Former smoker  Z87.891   5. Hx of cocaine abuse ([place])  F14.11   6. Encounter to discuss test results  Z71.2      RECOMMENDATIONS: Chad Thornton is a 64 y.o. male whose past medical history and cardiovascular risk factors include: Moderate aortic regurgitation, hypertension, former smoker, history of cocaine use, hyperlipidemia, ***  Aortic regurgitation, moderate: Asymptomatic and  stable Most recent echocardiogram notes the aortic regurgitation remains moderate in severity. Patient is asymptomatic currently. Patient's LVEF remains relatively stable.  Benign essential hypertension: ***  Former smoker: Educated on the importance of continued smoking cessation.  Mixed hyperlipidemia: Currently on statin therapy.  Does not endorse myalgias.  Currently being managed by primary care provider.  FINAL MEDICATION LIST END OF ENCOUNTER: No orders of the defined types were placed in this encounter.   There are no discontinued medications.   Current Outpatient Medications:  .  albuterol (PROVENTIL HFA;VENTOLIN HFA) 108 (90 Base) MCG/ACT inhaler, Inhale 1-2 puffs into the lungs every 4 (four) hours as needed for wheezing or shortness of breath., Disp: 1 Inhaler, Rfl: 0 .  amlodipine-olmesartan (AZOR) 10-20 MG tablet, Take 1 tablet by mouth daily., Disp: 90 tablet, Rfl: 3 .  aspirin EC 81 MG tablet, Take 1 tablet (81 mg total) by mouth daily. (Patient taking differently: Take 81 mg by mouth daily as needed. ), Disp: 90 tablet, Rfl:  3 .  atorvastatin (LIPITOR) 10 MG tablet, TAKE 1 TABLET BY MOUTH AT  BEDTIME, Disp: 90 tablet, Rfl: 0 .  CVS GENTLE LAXATIVE 5 MG EC tablet, continuous as needed., Disp: , Rfl:  .  latanoprost (XALATAN) 0.005 % ophthalmic solution, Place 1 drop into both eyes at bedtime., Disp: , Rfl:  .  mirtazapine (REMERON SOL-TAB) 15 MG disintegrating tablet, Take 15 mg by mouth at bedtime., Disp: , Rfl:  .  Multiple Vitamin (MULTIVITAMIN) tablet, Take 1 tablet by mouth daily., Disp: , Rfl:  .  Netarsudil-Latanoprost (ROCKLATAN) 0.02-0.005 % SOLN, Apply 1 drop to eye daily. In each at night, Disp: , Rfl:  .  prazosin (MINIPRESS) 2 MG capsule, Take 2 mg by mouth at bedtime., Disp: , Rfl:  .  QUEtiapine (SEROQUEL) 400 MG tablet, Take 400 mg by mouth at bedtime. , Disp: , Rfl:  .  sertraline (ZOLOFT) 100 MG tablet, Take 1.5 tablets by mouth at bedtime., Disp: , Rfl:   .  sodium chloride (OCEAN) 0.65 % SOLN nasal spray, Place 1 spray into both nostrils as needed (nasal passage irritation)., Disp: 30 mL, Rfl: 0  No orders of the defined types were placed in this encounter.    --Continue cardiac medications as reconciled in final medication list. --No follow-ups on file. Or sooner if needed. --Continue follow-up with your primary care physician regarding the management of your other chronic comorbid conditions.  Patient's questions and concerns were addressed to his satisfaction. He voices understanding of the instructions provided during this encounter.   This note was created using a voice recognition software as a result there may be grammatical errors inadvertently enclosed that do not reflect the nature of this encounter. Every attempt is made to correct such errors.  Tessa Lerner, [place], [facility]  Pager: [phone] Office: [phone]

## 2019-12-04 ENCOUNTER — Ambulatory Visit: Payer: Medicare Other | Admitting: Cardiology

## 2020-02-18 ENCOUNTER — Other Ambulatory Visit: Payer: Self-pay | Admitting: Gastroenterology

## 2020-02-18 DIAGNOSIS — R1033 Periumbilical pain: Secondary | ICD-10-CM

## 2020-02-26 ENCOUNTER — Other Ambulatory Visit (HOSPITAL_COMMUNITY): Payer: Self-pay | Admitting: Gastroenterology

## 2020-02-26 ENCOUNTER — Ambulatory Visit
Admission: RE | Admit: 2020-02-26 | Discharge: 2020-02-26 | Disposition: A | Discharge status: Home / Self Care | Payer: Medicare Other | Source: Ambulatory Visit | Attending: Gastroenterology | Admitting: Gastroenterology

## 2020-02-26 ENCOUNTER — Other Ambulatory Visit: Payer: Self-pay

## 2020-02-26 DIAGNOSIS — R9389 Abnormal findings on diagnostic imaging of other specified body structures: Secondary | ICD-10-CM

## 2020-02-26 DIAGNOSIS — J9859 Other diseases of mediastinum, not elsewhere classified: Secondary | ICD-10-CM

## 2020-02-26 DIAGNOSIS — R1033 Periumbilical pain: Secondary | ICD-10-CM

## 2020-02-26 MED ORDER — IOPAMIDOL (ISOVUE-300) INJECTION 61%
100.0000 mL | Freq: Once | INTRAVENOUS | Status: AC | PRN
  Administered 2020-02-26: 100 mL via INTRAVENOUS

## 2020-03-08 ENCOUNTER — Other Ambulatory Visit: Payer: Self-pay

## 2020-03-08 ENCOUNTER — Ambulatory Visit (HOSPITAL_COMMUNITY)
Admission: RE | Admit: 2020-03-08 | Discharge: 2020-03-08 | Disposition: A | Discharge status: Home / Self Care | Payer: Medicare Other | Source: Ambulatory Visit | Attending: Gastroenterology | Admitting: Gastroenterology

## 2020-03-08 DIAGNOSIS — J9859 Other diseases of mediastinum, not elsewhere classified: Secondary | ICD-10-CM | POA: Insufficient documentation

## 2020-03-08 DIAGNOSIS — R9389 Abnormal findings on diagnostic imaging of other specified body structures: Secondary | ICD-10-CM | POA: Diagnosis present

## 2020-03-08 MED ORDER — GADOBUTROL 1 MMOL/ML IV SOLN
10.0000 mL | Freq: Once | INTRAVENOUS | Status: AC | PRN
  Administered 2020-03-08: 10 mL via INTRAVENOUS

## 2020-03-28 ENCOUNTER — Encounter: Payer: Self-pay | Admitting: Internal Medicine

## 2020-03-28 ENCOUNTER — Ambulatory Visit (INDEPENDENT_AMBULATORY_CARE_PROVIDER_SITE_OTHER): Payer: Medicare Other | Admitting: Internal Medicine

## 2020-03-28 ENCOUNTER — Other Ambulatory Visit: Payer: Self-pay

## 2020-03-28 ENCOUNTER — Ambulatory Visit (INDEPENDENT_AMBULATORY_CARE_PROVIDER_SITE_OTHER): Payer: Medicare Other

## 2020-03-28 VITALS — BP 122/70 | HR 91 | Temp 97.5°F | Ht 71.0 in | Wt 264.5 lb

## 2020-03-28 DIAGNOSIS — R0609 Other forms of dyspnea: Secondary | ICD-10-CM

## 2020-03-28 DIAGNOSIS — R0600 Dyspnea, unspecified: Secondary | ICD-10-CM | POA: Diagnosis not present

## 2020-03-28 DIAGNOSIS — I10 Essential (primary) hypertension: Secondary | ICD-10-CM | POA: Diagnosis not present

## 2020-03-28 DIAGNOSIS — Q398 Other congenital malformations of esophagus: Secondary | ICD-10-CM | POA: Diagnosis not present

## 2020-03-28 NOTE — Patient Instructions (Addendum)
Please remember to go to the  x-ray department  for your tests - we will call you with the results when they are available, I will review with Dr Senaida Lange note and decide on what the next.       All refills for your blood pressure medications per Dr Jonette Mate

## 2020-03-28 NOTE — Progress Notes (Signed)
Chad Thornton, male    DOB: 1955-10-30,   MRN: [id number]   Brief patient profile:  63 yobm minimal smoking hx on disability due to back pain with hbp on acei  and acute onset sob 1st week in Jan 2020 in setting of uri/flu like with persistent cough/ chest tightness and sob with neg w/u by [name] so referred to pulmonary clinic 12/14/2018 by Dr  [name]      History of Present Illness  12/14/2018  Pulmonary/ 1st office eval/[name]  Chief Complaint  Patient presents with  . Pulmonary Consult    Referred by Dr. [name]. Pt c/o DOE x 3-4 months. He gets winded walking approx 1/4 mile.   Dyspnea:  Fast pace x 100 yards then has to stop but steps ok / assoc with chest tightrness  Cough: dry cough day > noct "throat tickle" Sleep: able to lie flat/ one pillow  SABA use: made him worse  rec Stop amlodipine- benazpril and start amlodipine 10 - olmesartan 20 mg one daily and your symptoms should gradually improve  over a couple of weeks and gone after a month > RESOLVED   03/28/2020  Consultation GI/ Dr  [name] / re abnormal CT  Chief Complaint  Patient presents with  . Follow-up    pt is here to go over results of mri   Dyspnea:  Not limited by breathing from desired activities   Cough: gone Sleeping: fine flat bed on pillow  SABA use: none  02: none New problem: 1st noted a month or so prior to OV  nauseated and constipation / eval by GI, better supine, still swallowing normally but losing wt, says having abdominal pain and really means "painfully nauseated" and very easily frustrated when trying to express his symptoms    No obvious day to day or daytime variability or assoc excess/ purulent sputum or mucus plugs or hemoptysis or  chest tightness, subjective wheeze or overt sinus or hb symptoms.   Sleeping without nocturnal  or early am exacerbation  of respiratory  c/o's or need for noct saba. Also denies any obvious fluctuation of symptoms with weather or environmental changes  or other aggravating or alleviating factors except as outlined above   No unusual exposure hx or h/o childhood pna/ asthma or knowledge of premature birth.  Current Allergies, Complete Past Medical History, Past Surgical History, Family History, and Social History were reviewed in [name] record.  ROS  The following are not active complaints unless bolded Hoarseness, sore throat, dysphagia, dental problems, itching, sneezing,  nasal congestion or discharge of excess mucus or purulent secretions, ear ache,   fever, chills, sweats, unintended wt loss or wt gain, classically pleuritic or exertional cp,  orthopnea pnd or arm/hand swelling  or leg swelling, presyncope, palpitations, abdominal pain, anorexia, nausea, vomiting, diarrhea  or change in bowel habits or change in bladder habits, change in stools or change in urine, dysuria, hematuria,  rash, arthralgias, visual complaints, headache, numbness, weakness or ataxia or problems with walking or coordination,  change in mood or  memory.        Current Meds  Medication Sig  . albuterol (PROVENTIL HFA;VENTOLIN HFA) 108 (90 Base) MCG/ACT inhaler Inhale 1-2 puffs into the lungs every 4 (four) hours as needed for wheezing or shortness of breath.  [name] amlodipine-olmesartan (AZOR) 10-20 MG tablet Take 1 tablet by mouth daily.  [name] aspirin EC 81 MG tablet Take 1 tablet (81 mg total) by mouth daily. (Patient taking  differently: Take 81 mg by mouth daily as needed. )  . atorvastatin (LIPITOR) 10 MG tablet TAKE 1 TABLET BY MOUTH AT  BEDTIME  . [employer] GENTLE LAXATIVE 5 MG EC tablet continuous as needed.  . latanoprost (XALATAN) 0.005 % ophthalmic solution Place 1 drop into both eyes at bedtime.  . mirtazapine (REMERON SOL-TAB) 15 MG disintegrating tablet Take 15 mg by mouth at bedtime.  . Multiple Vitamin (MULTIVITAMIN) tablet Take 1 tablet by mouth daily.  . Netarsudil-Latanoprost (ROCKLATAN) 0.02-0.005 % SOLN Apply 1 drop to eye daily.  In each at night  . prazosin (MINIPRESS) 2 MG capsule Take 2 mg by mouth at bedtime.  [name] QUEtiapine (SEROQUEL) 400 MG tablet Take 400 mg by mouth at bedtime.   . sodium chloride (OCEAN) 0.65 % SOLN nasal spray Place 1 spray into both nostrils as needed (nasal passage irritation).  . [DISCONTINUED] sertraline (ZOLOFT) 100 MG tablet Take 1.5 tablets by mouth at bedtime.                 Past Medical History:  Diagnosis Date  . Acid reflux   . Bipolar 1 disorder (HCC)   . Depression   . Hypertension        Objective:    amb pleasant bm nad   Wt Readings from Last 3 Encounters:  03/28/20 264 lb 8 oz (120 kg)  04/20/19 291 lb 9.6 oz (132.3 kg)  12/22/18 275 lb (124.7 kg)     Vital signs reviewed - Note on arrival 03/28/2020  02 sats  97% on RA  And BP 122/70     HEENT : pt wearing mask not removed for exam due to covid -19 concerns.    NECK :  without JVD/Nodes/TM/ nl carotid upstrokes bilaterally   LUNGS: no acc muscle use,  Nl contour chest which is clear to A and P bilaterally without cough on insp or exp maneuvers   CV:  RRR  no s3 or murmur or increase in P2, and no edema   ABD:  soft and nontender with nl inspiratory excursion in the supine position. No bruits or organomegaly appreciated, bowel sounds nl  MS:  Nl gait/ ext warm without deformities, calf tenderness, cyanosis or clubbing No obvious joint restrictions   SKIN: warm and dry without lesions    NEURO:  alert, approp, nl sensorium with  no motor or cerebellar deficits apparent.     CXR PA and Lateral:   03/28/2020 :    I personally reviewed images and   impression as follows:   No viz lung /paramediastinal mass         Assessment

## 2020-03-30 ENCOUNTER — Encounter: Payer: Self-pay | Admitting: Internal Medicine

## 2020-03-30 DIAGNOSIS — Q398 Other congenital malformations of esophagus: Secondary | ICD-10-CM | POA: Insufficient documentation

## 2020-03-30 NOTE — Assessment & Plan Note (Signed)
Trial off acei 12/14/2018 >  Dry tickle cough and sob all resolved 03/28/2020 and bp nl range  Although even in retrospect it may not be clear the ACEi contributed to the pt's symptoms,  Pt improved off them and adding them back at this point or in the future would risk confusion in interpretation of non-specific respiratory symptoms to which this patient is prone  ie  Better not to muddy the waters here.   >>> F/u pcp all refills through Dr [name].

## 2020-03-30 NOTE — Assessment & Plan Note (Addendum)
First noted 02/26/20 on abd ct for w/u of nausea and wt loss  -  See MRI chest 03/08/20>>   radiology does not recommend additional images  Case discussed with Dr [name].  My concern is that his original symptoms which are all GI related are new whereas whatever this cystic structure is (could also be benign bronchogenic cyst or pericardial cyst)  Appears to be completely asymptomatic and not related to his other GI issues.   The patient is very focused on this of course and it represents an incidentaloma for which I recommend a second opinion from Dr Andrey Spearman but no further pulmonary w/u.          Each maintenance medication was reviewed in detail including emphasizing most importantly the difference between maintenance and prns and under what circumstances the prns are to be triggered using an action plan format where appropriate.  Total time for H and P, chart review, counseling, teaching device and generating customized AVS unique to this office visit / charting = 51 min

## 2020-04-01 ENCOUNTER — Other Ambulatory Visit: Payer: Self-pay | Admitting: Internal Medicine

## 2020-04-01 DIAGNOSIS — Q398 Other congenital malformations of esophagus: Secondary | ICD-10-CM

## 2020-04-01 NOTE — Progress Notes (Signed)
Referral made 

## 2020-04-03 ENCOUNTER — Other Ambulatory Visit: Payer: Self-pay | Admitting: Internal Medicine

## 2020-04-03 ENCOUNTER — Other Ambulatory Visit: Payer: Self-pay | Admitting: Cardiology

## 2020-04-03 DIAGNOSIS — E7800 Pure hypercholesterolemia, unspecified: Secondary | ICD-10-CM

## 2020-04-08 ENCOUNTER — Encounter: Payer: Medicare Other | Admitting: Thoracic Surgery (Cardiothoracic Vascular Surgery)

## 2020-06-23 ENCOUNTER — Telehealth: Payer: Self-pay | Admitting: Internal Medicine

## 2020-06-23 NOTE — Telephone Encounter (Signed)
03/28/20 OV note faxed to [facility]. Nothing further needed at this time- will close encounter.

## 2021-04-08 ENCOUNTER — Other Ambulatory Visit: Payer: Self-pay | Admitting: Cardiology

## 2021-04-08 DIAGNOSIS — E7800 Pure hypercholesterolemia, unspecified: Secondary | ICD-10-CM
